# Patient Record
Sex: Male | Born: 1970
Health system: Southern US, Community
[De-identification: ages and names within clinical notes are randomized; demographics above are authoritative.]

## PROBLEM LIST (undated history)

## (undated) DIAGNOSIS — M519 Unspecified thoracic, thoracolumbar and lumbosacral intervertebral disc disorder: Secondary | ICD-10-CM

## (undated) DIAGNOSIS — M459 Ankylosing spondylitis of unspecified sites in spine: Secondary | ICD-10-CM

## (undated) HISTORY — DX: Ankylosing spondylitis of unspecified sites in spine: M45.9

## (undated) HISTORY — PX: LUMBAR DISC SURGERY: SHX700

## (undated) HISTORY — DX: Unspecified thoracic, thoracolumbar and lumbosacral intervertebral disc disorder: M51.9

---

## 2005-11-04 ENCOUNTER — Encounter: Admission: RE | Admit: 2005-11-04 | Discharge: 2005-11-04 | Payer: Self-pay | Admitting: Gastroenterology

## 2005-11-17 ENCOUNTER — Encounter: Admission: RE | Admit: 2005-11-17 | Discharge: 2005-11-17 | Payer: Self-pay | Admitting: *Deleted

## 2008-04-20 ENCOUNTER — Ambulatory Visit: Payer: Self-pay | Admitting: Pulmonary Disease

## 2008-04-20 DIAGNOSIS — R05 Cough: Secondary | ICD-10-CM

## 2008-04-20 DIAGNOSIS — R059 Cough, unspecified: Secondary | ICD-10-CM | POA: Insufficient documentation

## 2008-04-20 DIAGNOSIS — I499 Cardiac arrhythmia, unspecified: Secondary | ICD-10-CM | POA: Insufficient documentation

## 2008-04-20 DIAGNOSIS — M069 Rheumatoid arthritis, unspecified: Secondary | ICD-10-CM | POA: Insufficient documentation

## 2008-04-20 DIAGNOSIS — M5136 Other intervertebral disc degeneration, lumbar region: Secondary | ICD-10-CM | POA: Insufficient documentation

## 2008-04-27 ENCOUNTER — Telehealth (INDEPENDENT_AMBULATORY_CARE_PROVIDER_SITE_OTHER): Payer: Self-pay | Admitting: *Deleted

## 2008-05-08 ENCOUNTER — Ambulatory Visit: Payer: Self-pay | Admitting: Pulmonary Disease

## 2010-08-18 ENCOUNTER — Encounter: Payer: Self-pay | Admitting: Gastroenterology

## 2010-08-27 NOTE — Progress Notes (Signed)
Summary: cough med  Phone Note Call from Patient Call back at [phone]   Caller: Patient Call For: clance Reason for Call: Talk to Nurse Summary of Call: cough medicine hasn't made any difference, can you call in stronger cough med.  He has 2 days off for w/e and can take it. [facility] [address] Initial call taken by: Eugene Gavia,  April 27, 2008 2:17 PM  Follow-up for Phone Call        pt is requesting stonger cough meds.  was given tessalon perles at ov on 9/24.  please advise if you would like to call something stronger in for pt. Marijo File CMA  April 27, 2008 3:37 PM   Additional Follow-up for Phone Call Additional follow up Details #1::        pt called back requesting rx for cough. Tivis Ringer  April 28, 2008 2:38 PM    Additional Follow-up for Phone Call Additional follow up Details #2::    make sure that he stays on kapidex, STOPS chlorpheniramine. can call in tussicaps 10/8 one q12h as needed  #10, no fills make sure he uses his voice as little as possible, no throat clearing Follow-up by: Barbaraann Share MD,  May 01, 2008 3:01 PM  Additional Follow-up for Phone Call Additional follow up Details #3:: Details for Additional Follow-up Action Taken: Pt aware he can try Tussicaps, but will need to STOP the Chlorpheniramine while on the Tussicaps. He will call back to let [facility] know if this works for him. [facility] [address]. Additional Follow-up by: Michel Bickers CMA,  May 01, 2008 3:34 PM  New/Updated Medications: TUSSICAPS 10-8 MG XR12H-CAP (HYDROCOD POLST-CHLORPHEN POLST) 1 by mouth q12h as needed cough   Prescriptions: TUSSICAPS 10-8 MG XR12H-CAP (HYDROCOD POLST-CHLORPHEN POLST) 1 by mouth q12h as needed cough  #10 x 0   Entered by:   Michel Bickers CMA   Authorized by:   Barbaraann Share MD   Signed by:   Michel Bickers CMA on 05/01/2008   Method used:   Telephoned to ...       [facility]  [address] [facility]       [address]       [place], [place]  [address]       Ph: [phone] or [phone]       Fax: [phone]   RxID:   [id number]

## 2010-08-29 ENCOUNTER — Ambulatory Visit
Admission: RE | Admit: 2010-08-29 | Discharge: 2010-08-29 | Disposition: A | Discharge status: Home / Self Care | Payer: BC Managed Care – PPO | Source: Ambulatory Visit | Attending: Orthopedic Surgery | Admitting: Orthopedic Surgery

## 2010-08-29 ENCOUNTER — Other Ambulatory Visit: Payer: Self-pay | Admitting: Orthopedic Surgery

## 2010-08-29 DIAGNOSIS — R52 Pain, unspecified: Secondary | ICD-10-CM

## 2013-11-25 ENCOUNTER — Other Ambulatory Visit: Payer: Self-pay | Admitting: Gastroenterology

## 2013-11-25 DIAGNOSIS — R109 Unspecified abdominal pain: Secondary | ICD-10-CM

## 2013-11-29 ENCOUNTER — Other Ambulatory Visit: Payer: BC Managed Care – PPO

## 2013-12-01 ENCOUNTER — Ambulatory Visit
Admission: RE | Admit: 2013-12-01 | Discharge: 2013-12-01 | Disposition: A | Discharge status: Home / Self Care | Payer: BC Managed Care – PPO | Source: Ambulatory Visit | Attending: Gastroenterology | Admitting: Gastroenterology

## 2013-12-01 DIAGNOSIS — R109 Unspecified abdominal pain: Secondary | ICD-10-CM

## 2016-05-22 ENCOUNTER — Ambulatory Visit: Payer: Self-pay | Admitting: Rheumatology

## 2016-05-29 ENCOUNTER — Encounter: Payer: Self-pay | Admitting: Rheumatology

## 2016-06-03 ENCOUNTER — Other Ambulatory Visit: Payer: Self-pay | Admitting: Rheumatology

## 2016-06-03 NOTE — Telephone Encounter (Signed)
Last visit 12/06/15 Next visit 06/17/16 Labs 05/01/16 TB neg 05/22/16 Ok to refill per Dr Corliss Skainseveshwar

## 2016-06-12 DIAGNOSIS — M461 Sacroiliitis, not elsewhere classified: Secondary | ICD-10-CM | POA: Insufficient documentation

## 2016-06-12 DIAGNOSIS — M459 Ankylosing spondylitis of unspecified sites in spine: Secondary | ICD-10-CM | POA: Insufficient documentation

## 2016-06-12 DIAGNOSIS — Z79899 Other long term (current) drug therapy: Secondary | ICD-10-CM | POA: Insufficient documentation

## 2016-06-12 NOTE — Progress Notes (Deleted)
   Office Visit Note  Patient: Jon Henderson             Date of Birth: 08/15/1970           MRN: [id number]             PCP: No primary care provider on file. Referring: No ref. provider found Visit Date: 06/17/2016 Occupation: @GUAROCC @    Subjective:  No chief complaint on file.   History of Present Illness: Jon Ruizhomas G Whorley is a 45 y.o. male ***   Activities of Daily Living:  Patient reports morning stiffness for *** {minute/hour:19697}.   Patient {ACTIONS;DENIES/REPORTS:21021675::"Denies"} nocturnal pain.  Difficulty dressing/grooming: {ACTIONS;DENIES/REPORTS:21021675::"Denies"} Difficulty climbing stairs: {ACTIONS;DENIES/REPORTS:21021675::"Denies"} Difficulty getting out of chair: {ACTIONS;DENIES/REPORTS:21021675::"Denies"} Difficulty using hands for taps, buttons, cutlery, and/or writing: {ACTIONS;DENIES/REPORTS:21021675::"Denies"}   No Rheumatology ROS completed.   PMFS History:  Patient Active Problem List   Diagnosis Date Noted  . Ankylosing spondylitis (HCC) 06/12/2016  . Sacroiliitis (HCC) 06/12/2016  . High risk medication use 06/12/2016  . ABNORMAL HEART RHYTHMS 04/20/2008  . ARTHRITIS, RHEUMATOID 04/20/2008  . DEGENERATIVE DISC DISEASE 04/20/2008  . COUGH 04/20/2008    No past medical history on file.  No family history on file. No past surgical history on file. Social History   Social History Narrative  . No narrative on file     Objective: Vital Signs: There were no vitals taken for this visit.   Physical Exam   Musculoskeletal Exam: ***  CDAI Exam: No CDAI exam completed.    Investigation: Findings:   Last from Oct 5th 2017 shows CMP with GFR normal.  CBC with diff normal.  TB Gold was negative July 2016, due today    Imaging: No results found.  Speciality Comments: No specialty comments available.    Procedures:  No procedures performed Allergies: Patient has no allergy information on record.   Assessment / Plan: Visit  Diagnoses: Ankylosing spondylitis of sacral region (HCC)  Sacroiliitis (HCC)  High risk medication use - Enbrel neg TB gold9/2016    Orders: No orders of the defined types were placed in this encounter.  No orders of the defined types were placed in this encounter.   Face-to-face time spent with patient was *** minutes. 50% of time was spent in counseling and coordination of care.  Follow-Up Instructions: No Follow-up on file.   Amy Littrell, RT

## 2016-06-17 ENCOUNTER — Ambulatory Visit: Payer: Self-pay | Admitting: Rheumatology

## 2016-06-20 NOTE — Progress Notes (Signed)
Office Visit Note  Patient: Jon Henderson             Date of Birth: 02-16-1971           MRN: [id number]             PCP: Everardo Beals, M.D. Referring: No ref. provider found Visit Date: 06/24/2016 Occupation:     Subjective:  Left second toe pain   History of Present Illness: Jon Henderson is a 45 y.o. male with history of ankylosing spondylitis. He states that he's been very active and busy at work. He denies any joint pain or joint swelling. He has not had any SI joint discomfort. He injured his left second toe for which she went to [facility] urgent care he states the x-ray did not show any fracture. He notices some joint discomfort  related to certain activities.   Activities of Daily Living:  Patient reports morning stiffness for 0 minute.   Patient Denies nocturnal pain.  Difficulty dressing/grooming: Denies Difficulty climbing stairs: Denies Difficulty getting out of chair: Denies Difficulty using hands for taps, buttons, cutlery, and/or writing: Denies   Review of Systems  Constitutional: Negative for fatigue, night sweats and weakness ( ).  HENT: Negative for mouth sores, mouth dryness and nose dryness.   Eyes: Negative for redness and dryness.  Respiratory: Negative for shortness of breath and difficulty breathing.   Cardiovascular: Negative for chest pain, palpitations, hypertension, irregular heartbeat and swelling in legs/feet.  Gastrointestinal: Negative for constipation and diarrhea.  Endocrine: Negative for increased urination.  Musculoskeletal: Positive for arthralgias and joint pain. Negative for joint swelling, myalgias, muscle weakness, morning stiffness, muscle tenderness and myalgias.  Skin: Negative for color change, rash, hair loss, nodules/bumps, skin tightness, ulcers and sensitivity to sunlight.  Allergic/Immunologic: Negative for susceptible to infections.  Neurological: Negative for dizziness, fainting, memory loss and night sweats.    Hematological: Negative for swollen glands.  Psychiatric/Behavioral: Negative for depressed mood and sleep disturbance. The patient is not nervous/anxious.     PMFS History:  Patient Active Problem List   Diagnosis Date Noted  . Ankylosing spondylitis ([place]) 06/12/2016  . Sacroiliitis ([place]) 06/12/2016  . High risk medication use 06/12/2016  . ABNORMAL HEART RHYTHMS 04/20/2008  . DEGENERATIVE DISC DISEASE 04/20/2008  . COUGH 04/20/2008    Past Medical History:  Diagnosis Date  . Ankylosing spondylitis ([place])   . Disc disorder of lumbar region     Family History  Problem Relation Age of Onset  . COPD Father   . Stroke Maternal Grandfather    Past Surgical History:  Procedure Laterality Date  . LUMBAR DISC SURGERY     Social History   Social History Narrative  . No narrative on file     Objective: Vital Signs: BP 135/90 (BP Location: Left Arm, Patient Position: Sitting, Cuff Size: Large)   Pulse 77   Resp 14   Ht 6' (1.829 m)   Wt 197 lb (89.4 kg)   BMI 26.72 kg/m    Physical Exam  Constitutional: He is oriented to person, place, and time. He appears well-developed and well-nourished.  HENT:  Head: Normocephalic and atraumatic.  Eyes: Conjunctivae and EOM are normal. Pupils are equal, round, and reactive to light.  Neck: Normal range of motion. Neck supple.  Cardiovascular: Normal rate, regular rhythm and normal heart sounds.   Pulmonary/Chest: Effort normal and breath sounds normal.  Abdominal: Soft. Bowel sounds are normal.  Neurological: He is alert and  oriented to person, place, and time.  Skin: Skin is warm and dry. Capillary refill takes less than 2 seconds.  Psychiatric: He has a normal mood and affect. His behavior is normal.  Nursing note and vitals reviewed.    Musculoskeletal Exam: C-spine and thoracic lumbar spine good range of motion. No SI joint tenderness. Shoulder joints elbow joints wrist joint MCPs PIPs DIPs with good range of motion with no  synovitis hip joints knee joints ankles MTPs PIPs were good range of motion with no  synovitis.  CDAI Exam: No CDAI exam completed.    Investigation: Findings:  July 2010 SPEP normal, hepatitis panel negative, July 2016 TAB: Negative, 05/02/2016 CMP normal, CBC normal    Imaging: No results found.  Speciality Comments: No specialty comments available.    Procedures:  No procedures performed Allergies: Patient has no allergy information on record.   Assessment / Plan:     Visit Diagnoses: Ankylosing spondylitis  - HLA-B27 positive, rheumatoid factor positive, history of Sacroiliitis, iritis: He is doing really well with no SI joint pain no recurrence of iritis. He has no synovitis on examination.  High risk medication use - Enbrel every week. His labs are stable. He states he had his TB gold with his PCP and he will send the results to [place]. 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 Vertell Limber : Doing well.   Orders: Orders Placed This Encounter  Procedures  . CBC with Differential/Platelet  . COMPLETE METABOLIC PANEL WITH GFR   No orders of the defined types were placed in this encounter.   Face-to-face time spent with patient was 25 minutes. 50% of time was spent in counseling and coordination of care.  Follow-Up Instructions: Return in about 5 months (around 11/22/2016) for Ankylosing spondylitis.   Bo Merino, MD

## 2016-06-24 ENCOUNTER — Encounter: Payer: Self-pay | Admitting: Rheumatology

## 2016-06-24 ENCOUNTER — Ambulatory Visit (INDEPENDENT_AMBULATORY_CARE_PROVIDER_SITE_OTHER): Payer: BLUE CROSS/BLUE SHIELD | Admitting: Rheumatology

## 2016-06-24 VITALS — BP 135/90 | HR 77 | Resp 14 | Ht 72.0 in | Wt 197.0 lb

## 2016-06-24 DIAGNOSIS — Z79899 Other long term (current) drug therapy: Secondary | ICD-10-CM | POA: Diagnosis not present

## 2016-06-24 DIAGNOSIS — M457 Ankylosing spondylitis of lumbosacral region: Secondary | ICD-10-CM

## 2016-06-24 DIAGNOSIS — M47816 Spondylosis without myelopathy or radiculopathy, lumbar region: Secondary | ICD-10-CM

## 2016-06-24 NOTE — Patient Instructions (Signed)
Standing Labs We placed an order today for your standing lab work.    Please come back and get your standing labs in January and every 3 months  We have open lab Monday through Friday from 8:30-11:30 AM and 1-4 PM at the office of Dr. Gibran Veselka/Naitik Panwala, PA.   The office is located at [address], [address], [place], [place] [address] No appointment is necessary.   Labs are drawn by [facility].  You may receive a bill from [facility] for your lab work.    

## 2016-06-24 NOTE — Progress Notes (Signed)
Rheumatology Medication Review by a Pharmacist Does the patient feel that his/her medications are working for him/her?  Yes Has the patient been experiencing any side effects to the medications prescribed?  Yes Does the patient have any problems obtaining medications?  No  Issues to address at subsequent visits: None   Pharmacist comments:  Mr. Jon Henderson is a pleasant 45yo M presenting for follow up of his ankylosing spondylitis.  Patient is currently taking Enbrel 50 mg weekly and reports adherence with Enbrel.  Patient reports having itching at his injection site.  Advised patient to take an anti-histamine prior to injection to reduce itching.  He reports he has been told to do this before but forgets.  Patient denies any other medication related questions or concerns at this time.     Lilla Shookachel Lancelot Alyea, Pharm.D., BCPS Clinical Pharmacist Pager: [phone] Phone: [phone] 06/24/2016 3:51 PM

## 2016-08-26 ENCOUNTER — Other Ambulatory Visit: Payer: Self-pay | Admitting: Radiology

## 2016-08-26 NOTE — Telephone Encounter (Signed)
TB gold negative on 05/18/16 Labs were due this month Last visit 05/2016 Next visit 11/24/2016 Called patient to ask about his CBC CMP / when will he have these or have they been done at PCP already? He states he will go today for labs

## 2016-08-26 NOTE — Telephone Encounter (Signed)
Refill request received via fax for Enbrel [facility]

## 2016-08-28 ENCOUNTER — Telehealth: Payer: Self-pay | Admitting: Rheumatology

## 2016-08-28 NOTE — Telephone Encounter (Signed)
Attempted to contact the patient regarding his labs. Left message for patient to call the office.

## 2016-08-28 NOTE — Telephone Encounter (Signed)
Specialty Pharmacy called about Enbrel refill request they faxed over. They are requesting a call back at [phone] ext. [phone]

## 2016-08-28 NOTE — Telephone Encounter (Signed)
Advised them we are awaiting the patient's labs before we respond to the refill request and we have reached out to the patient.

## 2016-08-29 ENCOUNTER — Telehealth: Payer: Self-pay | Admitting: Rheumatology

## 2016-08-29 NOTE — Telephone Encounter (Signed)
Patient returned Andrea's call. Please call patient back.  

## 2016-08-29 NOTE — Telephone Encounter (Signed)
Advised patient we are looking for his lab results. Patient states he had them done Tuesday and is calling to have the results faxed to our office.

## 2016-09-02 MED ORDER — ETANERCEPT 50 MG/ML ~~LOC~~ SOAJ
SUBCUTANEOUS | 2 refills | Status: DC
Start: 2016-09-02 — End: 2016-09-12

## 2016-09-02 NOTE — Telephone Encounter (Signed)
Last visit 05/2016 Next visit 11/24/2016 Labs:08/28/15 WNL Tb Gold: 05/17/16 Neg  Okay to refill Enbrel?

## 2016-09-02 NOTE — Addendum Note (Signed)
Addended by: Henriette CombsHATTON, Keyundra Fant L on: 09/02/2016 09:09 AM   Modules accepted: Orders

## 2016-09-03 ENCOUNTER — Telehealth: Payer: Self-pay | Admitting: *Deleted

## 2016-09-03 NOTE — Telephone Encounter (Signed)
Patient in office to pick up sample that was okayed per [name]  Medication Samples have been provided to the patient.  Drug name: Enbrel       Strength:50 mg/mL        Qty:1  LOT: [id number]  Exp.Date:1/20  Dosing instructions: Inject 1 pen SQ every week  The patient has been instructed regarding the correct time, dose, and frequency of taking this medication, including desired effects and most common side effects.   Jon Henderson 3:33 PM 09/03/2016

## 2016-09-12 ENCOUNTER — Telehealth: Payer: Self-pay | Admitting: Pharmacist

## 2016-09-12 MED ORDER — ETANERCEPT 50 MG/ML ~~LOC~~ SOAJ: SUBCUTANEOUS | 2 refills | Status: DC

## 2016-09-12 NOTE — Telephone Encounter (Signed)
Medication Samples have been provided to the patient.  Drug name: Enbrel       Strength: 50 mg        Qty: 2  LOT: [id number]  Exp.Date: 07/2018  Dosing instructions: Inject under the skin once a week.   The patient has been instructed regarding the correct time, dose, and frequency of taking this medication, including desired effects and most common side effects.   Informed patient we just received a fax from his insurance stating [facility] is the required pharmacy and PA is required for Enbrel.  PA submitted through Cover My Meds.  Will update patient once I know status of PA.    Sue Lushndrea, can you resend patient's Enbrel prescription to [facility]?  Thanks!   Jon MiresRachel L Henderson 4:09 PM 09/12/2016

## 2016-09-12 NOTE — Telephone Encounter (Signed)
Sample medication labeled and set aside in the refrigerator for patient to pick up.

## 2016-09-12 NOTE — Telephone Encounter (Signed)
Prescription sent to pharmacy.

## 2016-09-12 NOTE — Telephone Encounter (Signed)
Okay to give patient 2 boxes of Enbrel samples pleaseIf you don't mind, can we asked Jon Henderson to get us more Enbrel for our stock

## 2016-09-12 NOTE — Telephone Encounter (Signed)
Received two faxes from [facility] regarding patient's Enbrel.  The first fax stated they need patient's insurance and ICD 10 code related to Enbrel.  The second fax stated that patient has a new specialty pharmacy and has been unenrolled.    Reviewed patient's chart.  I do not see that the prescription has been sent to any other specialty pharmacy.  I called patient to discuss.  He states he got his Enbrel coupon card, but lost it so he has to get a new one before he can fill his Enbrel.  He said he does not know why he has been unenrolled from [facility] as he plans to fill the prescription there.  I reviewed patient's insurance and it appears that [facility] is the required pharmacy.  Patient reports he will look into his insurance plan more and let us know if we need to resend the prescription.  Patient plans to order new Enbrel card and call his insurance today.  Patient states he is out of Enbrel and requested another sample.   Last visit: 06/24/16 Next visit: 11/24/16 Labs: 08/27/16 CBC normal, CMP normal; 05/18/16 TB negative  [name], okay to give patient Enbrel sample?

## 2016-09-16 ENCOUNTER — Telehealth: Payer: Self-pay | Admitting: Pharmacist

## 2016-09-16 NOTE — Telephone Encounter (Signed)
Received notification from Cover My Meds that patient's Enbrel was approved.  Can you please update patient?

## 2016-09-16 NOTE — Telephone Encounter (Signed)
Left a message on patients cell phone. Told him to call back if he had any questions.

## 2016-09-16 NOTE — Telephone Encounter (Signed)
Spoke to patient.  He confirms that [facility] is currently processing his prescription.  Advised him to call us if he has any further questions or concerns regarding his medication.

## 2016-09-18 ENCOUNTER — Telehealth: Payer: Self-pay | Admitting: Pharmacist

## 2016-09-18 NOTE — Telephone Encounter (Signed)
Received fax from [facility] requesting ICD-10 code for patient's Enbrel prescription.  [facility] and provided her with the diagnosis code M45.7 as documented in patient's chart.     Lilla Shookachel Amarea Macdowell, Pharm.D., BCPS, CPP Clinical Pharmacist Pager: [phone] Phone: [phone] 09/18/2016 8:52 AM

## 2016-11-21 NOTE — Assessment & Plan Note (Signed)
TB gold negative 05/18/2016 Enbrel 50 mg every week(adequate response)

## 2016-11-21 NOTE — Progress Notes (Signed)
Office Visit Note  Patient: Jon Henderson             Date of Birth: [birth year]           MRN: [id number]             PCP: Imelda Pillow, NP Referring: Everardo Beals, NP Visit Date: 11/24/2016 Occupation: '@GUAROCC'$ @    Subjective:  Follow-up (Need RX Celebrex and Protonox)   History of Present Illness: Jon Henderson is a 46 y.o. male  Last seen 06/24/2016 for ankylosing spondylitis(HLA-B27 positive)/rheumatoid factor positive . On that visit, patient reported that he was doing very well with his ankylosing spondylitis. He was taking Enbrel 50 mg weekly and was not experiencing any SI joint discomfort. He also did not have any recurrence of iritis.  Today, patient is reporting that he is having some flares to his left fourth and fifth MCP joint. Usually happens on day for 4-5 after his Enbrel injection. By the time he is due for the injection is MCP joints are quite bothersome. After the patient gets his Enbrel injection, he takes about a day or 2 for his joints is homebound. We're uncertain whether he is getting inadequate response from Enbrel because of all the extra work that he is doing or is the Enbrel no longer working well.  Left hand pain and fourth and fifth MCP joint is a new complaint for the patient.  In the past, he was doing really well with all of his joints including his SI joints.     Activities of Daily Living:  Patient reports morning stiffness for 30 minutes.   Patient Reports nocturnal pain.  Difficulty dressing/grooming: Denies Difficulty climbing stairs: Denies Difficulty getting out of chair: Denies Difficulty using hands for taps, buttons, cutlery, and/or writing: Reports   Review of Systems  Constitutional: Negative for fatigue.  HENT: Negative for mouth sores and mouth dryness.   Eyes: Negative for dryness.  Respiratory: Negative for shortness of breath.   Gastrointestinal: Negative for constipation and diarrhea.  Musculoskeletal:  Negative for myalgias and myalgias.  Skin: Negative for sensitivity to sunlight.  Neurological: Negative for memory loss.  Psychiatric/Behavioral: Negative for sleep disturbance.    PMFS History:  Patient Active Problem List   Diagnosis Date Noted  . Ankylosing spondylitis ([place]) 06/12/2016  . Sacroiliitis ([place]) 06/12/2016  . High risk medication use 06/12/2016  . ABNORMAL HEART RHYTHMS 04/20/2008  . DEGENERATIVE DISC DISEASE 04/20/2008  . COUGH 04/20/2008    Past Medical History:  Diagnosis Date  . Ankylosing spondylitis ([place])   . Disc disorder of lumbar region     Family History  Problem Relation Age of Onset  . COPD Father   . Stroke Maternal Grandfather    Past Surgical History:  Procedure Laterality Date  . LUMBAR DISC SURGERY     Social History   Social History Narrative  . No narrative on file     Objective: Vital Signs: BP 132/77 (BP Location: Left Arm, Patient Position: Sitting, Cuff Size: Normal)   Pulse 82   Resp 12   Ht 6' (1.829 m)   Wt 197 lb (89.4 kg)   BMI 26.72 kg/m    Physical Exam  Constitutional: He is oriented to person, place, and time. He appears well-developed and well-nourished.  HENT:  Head: Normocephalic and atraumatic.  Eyes: Conjunctivae and EOM are normal. Pupils are equal, round, and reactive to light.  Neck: Normal range of motion. Neck supple.  Cardiovascular: Normal  rate, regular rhythm and normal heart sounds.  Exam reveals no gallop and no friction rub.   No murmur heard. Pulmonary/Chest: Effort normal and breath sounds normal. No respiratory distress. He has no wheezes. He has no rales. He exhibits no tenderness.  Abdominal: Soft. He exhibits no distension and no mass. There is no tenderness. There is no guarding.  Musculoskeletal: Normal range of motion.  Lymphadenopathy:    He has no cervical adenopathy.  Neurological: He is alert and oriented to person, place, and time. He exhibits normal muscle tone. Coordination  normal.  Skin: Skin is warm and dry. Capillary refill takes less than 2 seconds. No rash noted.  Psychiatric: He has a normal mood and affect. His behavior is normal. Judgment and thought content normal.  Vitals reviewed.    Musculoskeletal Exam:  Full range of motion of all joints Grip strength is equal and strong bilaterally Fiber myalgia tender points are all absent  CDAI Exam: No CDAI exam completed.  No synovitis on examination  Investigation: No additional findings.  See scanned lab results in MEDIA TAB.  Patient is getting labs done at [facility] urgent care. TB gold negative 05/18/2016 CBC with differential ; CMP with GFR within normal limits August 27, 2016   Imaging: No results found.  Speciality Comments: No specialty comments available.    Procedures:  No procedures performed Allergies: Patient has no allergy information on record.   Assessment / Plan:     Visit Diagnoses: Ankylosing spondylitis of sacral region [facility]) - 11/24/2016: No SI joint pain; no recurrence of iritis; adequate response with Enbrel 50 mg every week  High risk medication use - TB gold negative 10/22/2017Enbrel 50 mg every week(adequate response) - Plan: CBC with Differential/Platelet, CMP14+EGFR, CBC with Differential/Platelet, CMP14+EGFR, CBC with Differential/Platelet, CMP14+EGFR  Sacroiliitis (H[facility])    Plan: #1: Ankylosing spondylitis Possible INadequate response with Enbrel 50 mg every week No SI joint pain No recurrence of iritis  #2: High risk prescription. TB gold negative 05/18/2016 Enbrel 50 mg every week( possible inadequate response-starting to complain of pain to left 4th and 5th mcp joint on approx day 4-5 after enbrel injection.)  #3: Left hand pain at the fourth and fifth MCP joint Possible in neck or response to Enbrel Possible overuse syndrome  #4: Right knee pain rated 6 on a scale of 0-10 since early December 2017. Starting late April 2018, patient's  right knee pain finally resolved. Patient does not want any investigation at this time but he will let us know if the right knee begins to bother him once again  #5: Schedule ultrasound of bilateral hands and wrists on a Wednesday with Dr. Estanislado Pandy. Patient usually takes his injections of Enbrel on Wednesday. Since patient has significant pain on the day that his injection is due, it is likely that we might be able to find synovitis if patient is getting inadequate response to Enbrel.  #6: CBC with differential and CMP with GFR at lab core this week. Patient likes to go to [facility] urgent care and will get his labs done there and forwarded to our office. Please see scanned labs in media TAB. The last labs we have is from 08/26/2016.  #7: Return to clinic in 5 months for regular follow-up and as scheduled for ultrasound of bilateral hands due to having left fourth and fifth MCP with pain  Orders: Orders Placed This Encounter  Procedures  . CBC with Differential/Platelet  . CMP14+EGFR  . CBC with Differential/Platelet  .  CMP14+EGFR   No orders of the defined types were placed in this encounter.   Face-to-face time spent with patient was 30 minutes. 50% of time was spent in counseling and coordination of care.  Follow-Up Instructions: Return in about 5 months (around 04/26/2017) for A.S. // left 4th-5th MCP pain// Enbrel//rt knee pain.   Eliezer Lofts, PA-C  Note - This record has been created using [employer].  Chart creation errors have been sought, but may not always  have been located. Such creation errors do not reflect on  the standard of medical care.

## 2016-11-21 NOTE — Assessment & Plan Note (Signed)
HLA-B27 positive Rheumatoid factor positive

## 2016-11-24 ENCOUNTER — Ambulatory Visit (INDEPENDENT_AMBULATORY_CARE_PROVIDER_SITE_OTHER): Payer: BLUE CROSS/BLUE SHIELD | Admitting: Rheumatology

## 2016-11-24 ENCOUNTER — Encounter: Payer: Self-pay | Admitting: Rheumatology

## 2016-11-24 VITALS — BP 132/77 | HR 82 | Resp 12 | Ht 72.0 in | Wt 197.0 lb

## 2016-11-24 DIAGNOSIS — M461 Sacroiliitis, not elsewhere classified: Secondary | ICD-10-CM

## 2016-11-24 DIAGNOSIS — M458 Ankylosing spondylitis sacral and sacrococcygeal region: Secondary | ICD-10-CM | POA: Diagnosis not present

## 2016-11-24 DIAGNOSIS — Z79899 Other long term (current) drug therapy: Secondary | ICD-10-CM

## 2016-11-24 NOTE — Progress Notes (Signed)
Rheumatology Medication Review by a Pharmacist Does the patient feel that his/her medications are working for him/her?  Yes Has the patient been experiencing any side effects to the medications prescribed?  No Does the patient have any problems obtaining medications?  No  Issues to address at subsequent visits: None   Pharmacist comments:  Jon Henderson is a pleasant 46 yo M who presents for follow up of ankylosing spondylitis.  He is currently taking Enbrel 50 mg weekly.  He reports adherence with his medications, and denies any adverse events.  He previously had mild injection site reactions, but reports those have resolved.  Patient had trouble getting his Enbrel in February.  Patient confirms he received his Enbrel from [employer].  Patient denies any questions or concerns regarding his medications at this time.  Lilla Shook, Pharm.D., BCPS, CPP Clinical Pharmacist Pager: [phone] Phone: [phone] 11/24/2016 4:27 PM

## 2016-12-11 ENCOUNTER — Telehealth: Payer: Self-pay | Admitting: *Deleted

## 2016-12-11 NOTE — Telephone Encounter (Signed)
Received labs drawn on 11/28/16 CBC and CMP WNL

## 2016-12-12 ENCOUNTER — Other Ambulatory Visit: Payer: Self-pay | Admitting: *Deleted

## 2016-12-12 MED ORDER — ETANERCEPT 50 MG/ML ~~LOC~~ SOAJ: SUBCUTANEOUS | 2 refills | Status: DC

## 2016-12-12 NOTE — Telephone Encounter (Signed)
Refill request received via fax for Enbrel  Last Visit: 11/24/16 Next Visit: 04/29/17 Labs: 11/28/16 CMP/CBC WNL TB Gold: 05/17/17 Neg  Okay to refill per Dr. Corliss Skainseveshwar

## 2016-12-15 ENCOUNTER — Telehealth: Payer: Self-pay | Admitting: Rheumatology

## 2016-12-15 NOTE — Telephone Encounter (Signed)
[employer] calling to request rf on Enbrel be refaxed. They do not have it. Fax# [phone] Jon with [employer].

## 2016-12-15 NOTE — Telephone Encounter (Signed)
Called [employer] and gave verbal authorization for refill of enbrel.

## 2016-12-26 NOTE — Progress Notes (Signed)
Assessment / Plan:     Visit Diagnoses: Ankylosing spondylitis of sacral region [facility]) - 11/24/2016: No SI joint pain; no recurrence of iritis; adequate response with Enbrel 50 mg every week  High risk medication use - TB gold negative 10/22/2017Enbrel 50 mg every week(adequate response) - Plan: CBC with Differential/Platelet, CMP14+EGFR, CBC with Differential/Platelet, CMP14+EGFR, CBC with Differential/Platelet, CMP14+EGFR  Sacroiliitis (HCC)    Plan: #1: Ankylosing spondylitis Possible INadequate response with Enbrel 50 mg every week No SI joint pain No recurrence of iritis  #2: High risk prescription. TB gold negative 05/18/2016 Enbrel 50 mg every week( possible inadequate response-starting to complain of pain to left 4th and 5th mcp joint on approx day 4-5 after enbrel injection.)  #3: Left hand pain at the fourth and fifth MCP joint Possible in neck or response to Enbrel Possible overuse syndrome  #4: Right knee pain rated 6 on a scale of 0-10 since early December 2017. Starting late April 2018, patient's right knee pain finally resolved. Patient does not want any investigation at this time but he will let us know if the right knee begins to bother him once again  #5: Schedule ultrasound of bilateral hands and wrists on a Wednesday with Dr. Estanislado Pandy. Patient usually takes his injections of Enbrel on Wednesday. Since patient has significant pain on the day that his injection is due, it is likely that we might be able to find synovitis if patient is getting inadequate response to Enbrel.  #6: CBC with differential and CMP with GFR at lab core this week. Patient likes to go to [facility] urgent care and will get his labs done there and forwarded to our office. Please see scanned labs in media TAB. The last labs we have is from 08/26/2016.  #7: Return to clinic in 5 months for regular follow-up and as scheduled for ultrasound of bilateral hands due to having left  fourth and fifth MCP with pain  Orders:    Orders Placed This Encounter  Procedures  . CBC with Differential/Platelet  . CMP14+EGFR  . CBC with Differential/Platelet  . CMP14+EGFR   No orders of the defined types were placed in this encounter.   Face-to-face time spent with patient was 30 minutes. 50% of time was spent in counseling and coordination of care.  Follow-Up Instructions: Return in about 5 months (around 04/26/2017) for A.S. // left 4th-5th MCP pain// Enbrel//rt knee pain.

## 2016-12-31 ENCOUNTER — Ambulatory Visit (INDEPENDENT_AMBULATORY_CARE_PROVIDER_SITE_OTHER): Payer: BLUE CROSS/BLUE SHIELD | Admitting: Rheumatology

## 2016-12-31 ENCOUNTER — Telehealth: Payer: Self-pay | Admitting: Pharmacist

## 2016-12-31 ENCOUNTER — Inpatient Hospital Stay (INDEPENDENT_AMBULATORY_CARE_PROVIDER_SITE_OTHER): Payer: Self-pay

## 2016-12-31 DIAGNOSIS — M79641 Pain in right hand: Secondary | ICD-10-CM

## 2016-12-31 DIAGNOSIS — M79642 Pain in left hand: Secondary | ICD-10-CM

## 2016-12-31 NOTE — Telephone Encounter (Signed)
Dr. Corliss Skainseveshwar wants to consider switching patient to Cosentyx at patient's follow up visit.  Can you submit prior authorization for Cosentyx 150 mg dose?

## 2017-01-01 ENCOUNTER — Telehealth: Payer: Self-pay

## 2017-01-01 NOTE — Telephone Encounter (Signed)
Submitted a prior authorization for Cosentyx through Cover My Meds. Will update once we receive a response.   Janis Sol, [place], CPhT 9:35 AM

## 2017-01-05 NOTE — Telephone Encounter (Signed)
Received fax asking for qualification on whether the request was for the treatment of ankylosing spondylitis with a loading dose.  Provided clarification that loading dose will be used.  Form was faxed back to patient's insurance.    Lilla Shookachel Henderson, Pharm.D., BCPS, CPP Clinical Pharmacist Pager: [phone] Phone: [phone] 01/05/2017 11:19 AM

## 2017-01-05 NOTE — Progress Notes (Signed)
Office Visit Note  Patient: Jon Henderson             Date of Birth: 04-03-1971           MRN: [id number]             PCP: Marva PandaMillsaps, Kimberly, NP Referring: Marva PandaMillsaps, Kimberly, NP Visit Date: 01/07/2017 Occupation: @GUAROCC @    Subjective:  Pain and swelling in hands.   History of Present Illness: Jon Henderson is a 46 y.o. male with history of ankylosing spondylitis. He's been having increased pain and swelling in his hands recently. We did ultrasound examination of bilateral hands on 12/31/2016 which showed synovitis in his bilateral hands. He's been on Enbrel for since 2007. It is not effective anymore. His SI joints are not as painful. He has some discomfort which is work-related. He gives history of intermittent diarrhea which she believes is secondary to IBS. He has had endoscopy and colonoscopy 2 years ago which was negative except for reflux.  Activities of Daily Living:  Patient reports morning stiffness for 15 minutes.   Patient Reports nocturnal pain.  Difficulty dressing/grooming: Denies Difficulty climbing stairs: Denies Difficulty getting out of chair: Denies Difficulty using hands for taps, buttons, cutlery, and/or writing: Denies   Review of Systems  Constitutional: Positive for fatigue. Negative for night sweats and weakness ( ).  HENT: Negative for mouth sores, mouth dryness and nose dryness.   Eyes: Negative for redness and dryness.  Respiratory: Negative for shortness of breath and difficulty breathing.   Cardiovascular: Negative for chest pain, palpitations, hypertension, irregular heartbeat and swelling in legs/feet.  Gastrointestinal: Positive for diarrhea. Negative for constipation.       Occasional  Endocrine: Negative for increased urination.  Musculoskeletal: Positive for arthralgias, joint pain and morning stiffness. Negative for joint swelling, myalgias, muscle weakness, muscle tenderness and myalgias.  Skin: Negative for color change, rash, hair  loss, nodules/bumps, skin tightness, ulcers and sensitivity to sunlight.  Allergic/Immunologic: Negative for susceptible to infections.  Neurological: Negative for dizziness, fainting, memory loss and night sweats.  Hematological: Negative for swollen glands.  Psychiatric/Behavioral: Negative for depressed mood and sleep disturbance. The patient is not nervous/anxious.     PMFS History:  Patient Active Problem List   Diagnosis Date Noted  . Ankylosing spondylitis (HCC) 06/12/2016  . Sacroiliitis ([facility]) 06/12/2016  . High risk medication use 06/12/2016  . ABNORMAL HEART RHYTHMS 04/20/2008  . DDD (degenerative disc disease), lumbar 04/20/2008  . COUGH 04/20/2008    Past Medical History:  Diagnosis Date  . Ankylosing spondylitis (HCC)   . Disc disorder of lumbar region     Family History  Problem Relation Age of Onset  . COPD Father   . Stroke Maternal Grandfather    Past Surgical History:  Procedure Laterality Date  . LUMBAR DISC SURGERY     Social History   Social History Narrative  . No narrative on file     Objective: Vital Signs: BP 127/78 (BP Location: Left Arm, Patient Position: Sitting, Cuff Size: Normal)   Pulse 83   Resp 14   Ht 6' (1.829 m)   Wt 194 lb (88 kg)   BMI 26.31 kg/m    Physical Exam  Constitutional: He is oriented to person, place, and time. He appears well-developed and well-nourished.  HENT:  Head: Normocephalic and atraumatic.  Eyes: Conjunctivae and EOM are normal. Pupils are equal, round, and reactive to light.  Neck: Normal range of motion. Neck supple.  Cardiovascular:  Normal rate, regular rhythm and normal heart sounds.   Pulmonary/Chest: Effort normal and breath sounds normal.  Abdominal: Soft. Bowel sounds are normal.  Neurological: He is alert and oriented to person, place, and time.  Skin: Skin is warm and dry. Capillary refill takes less than 2 seconds.  Psychiatric: He has a normal mood and affect. His behavior is normal.    Nursing note and vitals reviewed.    Musculoskeletal Exam: C-spine and thoracic lumbar spine good range of motion. Shoulder joints elbow joints wrist joint MCPs PIPs DIPs with good range of motion. He has some tenderness over her MCP joints and wrists joint as described below. Hip joints knee joints ankles MTPs PIPs with good range of motion with no synovitis. He had no tenderness over SI joint area.  CDAI Exam: CDAI Homunculus Exam:   Tenderness:  RUE: wrist Right hand: 4th MCP and 5th MCP Left hand: 5th MCP  Joint Counts:  CDAI Tender Joint count: 4 CDAI Swollen Joint count: 0  Global Assessments:  Patient Global Assessment: 5 Provider Global Assessment: 5  CDAI Calculated Score: 14    Investigation: Findings:  TB gold negative 05/18/2016  11/28/16 CBC and CMP normal      Imaging: [place] Extrem Up Bilat Comp  Result Date: 12/31/2016 Ultrasound examination of bilateral hands was performed per EULAR recommendations. Using 12 MHz transducer, grayscale and power Doppler bilateral second, third, and fifth MCP joints and bilateral wrist joints both dorsal and volar aspects were evaluated to look for synovitis or tenosynovitis. The findings were there was  synovitis in bilateral second third and fifth MCP joints and bilateral wrist joints  on ultrasound examination. Right median nerve was 0.07 cm squares which was within normal limits and left median nerve was 0.11 cm squares which was within normal limits.. Impression: Ultrasound examination of bilateral hand shows synovitis in bilateral second third and fifth MCP joints and bilateral wrist joints consistent with flare of inflammatory arthritis.   Speciality Comments: No specialty comments available.    Procedures:  No procedures performed Allergies: Patient has no allergy information on record.   Assessment / Plan:     Visit Diagnoses: Ankylosing spondylitis of sacral region [facility]) - Ultrasound examination of bilateral hands  on 12/31/2016 showed bilateral hands synovitis. Patient is not responding to Enbrel anymore. He continues to have some tenderness some in his hands and wrists joints. He believes that the Enbrel is not injecting properly. He had discussion regarding different treatment options and side effects at length. After reviewing indications CONTRAINDICATIONS were decided to proceed with Cosyntex. We will apply for Cosyntex. He will use discontinue Enbrel one week prior to his Cosyntex  Dose.   Sacroiliitis [facility]): Not active  High risk medication use - Inadequate response to Enbrel. Start Cosyntex after approved. Plan labs a month after his first Cosyntex dose and then we'll monitor every 3 months.- Plan: HIV antibody, IgG, IgA, IgM, CBC with Differential/Platelet, COMPLETE METABOLIC PANEL WITH GFR, HIV antibody, IgG, IgA, IgM  DDD (degenerative disc disease), lumbar: Doing fairly well is intermittent discomfort related to activities.  History of gastroesophageal reflux (GERD): Controlled with medication     Orders: Orders Placed This Encounter  Procedures  . HIV antibody  . IgG, IgA, IgM   No orders of the defined types were placed in this encounter.   Face-to-face time spent with patient was 30 minutes. 50% of time was spent in counseling and coordination of care.  Follow-Up Instructions: Return in about 3  months (around 04/09/2017) for Ankylosing spondylitis.   Pollyann Savoy, MD  Note - This record has been created using [employer].  Chart creation errors have been sought, but may not always  have been located. Such creation errors do not reflect on  the standard of medical care.

## 2017-01-07 ENCOUNTER — Ambulatory Visit (INDEPENDENT_AMBULATORY_CARE_PROVIDER_SITE_OTHER): Payer: BLUE CROSS/BLUE SHIELD | Admitting: Rheumatology

## 2017-01-07 ENCOUNTER — Encounter: Payer: Self-pay | Admitting: Rheumatology

## 2017-01-07 VITALS — BP 127/78 | HR 83 | Resp 14 | Ht 72.0 in | Wt 194.0 lb

## 2017-01-07 DIAGNOSIS — M458 Ankylosing spondylitis sacral and sacrococcygeal region: Secondary | ICD-10-CM | POA: Diagnosis not present

## 2017-01-07 DIAGNOSIS — M5136 Other intervertebral disc degeneration, lumbar region: Secondary | ICD-10-CM | POA: Diagnosis not present

## 2017-01-07 DIAGNOSIS — Z79899 Other long term (current) drug therapy: Secondary | ICD-10-CM

## 2017-01-07 DIAGNOSIS — M461 Sacroiliitis, not elsewhere classified: Secondary | ICD-10-CM

## 2017-01-07 DIAGNOSIS — Z8719 Personal history of other diseases of the digestive system: Secondary | ICD-10-CM

## 2017-01-07 LAB — COMPLETE METABOLIC PANEL WITH GFR

## 2017-01-07 LAB — HIV ANTIBODY (ROUTINE TESTING W REFLEX)

## 2017-01-07 LAB — IGG, IGA, IGM

## 2017-01-07 NOTE — Patient Instructions (Signed)
Secukinumab injection What is this medicine? SECUKINUMAB (sek ue KIN ue mab) is used to treat psoriasis. It is also used to treat psoriatic arthritis and ankylosing spondylitis. This medicine may be used for other purposes; ask your health care provider or pharmacist if you have questions. COMMON BRAND NAME(S): Cosentyx What should I tell my health care provider before I take this medicine? They need to know if you have any of these conditions: -Crohn's disease, ulcerative colitis, or other inflammatory bowel disease -infection or history of infection -other conditions affecting the immune system -recently received or are scheduled to receive a vaccine -tuberculosis, a positive skin test for tuberculosis, or have recently been in close contact with someone who has tuberculosis -an unusual or allergic reaction to secukinumab, other medicines, latex, rubber, foods, dyes, or preservatives -pregnant or trying to get pregnant -breast-feeding How should I use this medicine? This medicine is for injection under the skin. It may be administered by a healthcare professional in a hospital or clinic setting or at home. If you get this medicine at home, you will be taught how to prepare and give this medicine. Use exactly as directed. Take your medicine at regular intervals. Do not take your medicine more often than directed. It is important that you put your used needles and syringes in a special sharps container. Do not put them in a trash can. If you do not have a sharps container, call your pharmacist or healthcare provider to get one. A special MedGuide will be given to you by the pharmacist with each prescription and refill. Be sure to read this information carefully each time. Talk to your pediatrician regarding the use of this medicine in children. Special care may be needed. Overdosage: If you think you have taken too much of this medicine contact a poison control center or emergency room at  once. NOTE: This medicine is only for you. Do not share this medicine with others. What if I miss a dose? It is important not to miss your dose. Call your doctor of health care professional if you are unable to keep an appointment. If you give yourself the medicine and you miss a dose, take it as soon as you can. If it is almost time for your next dose, take only that dose. Do not take double or extra doses. What may interact with this medicine? Do not take this medicine with any of the following medications: -live virus vaccines This medicine may also interact with the following medications: -cyclosporine -inactivated vaccines -warfarin This list may not describe all possible interactions. Give your health care provider a list of all the medicines, herbs, non-prescription drugs, or dietary supplements you use. Also tell them if you smoke, drink alcohol, or use illegal drugs. Some items may interact with your medicine. What should I watch for while using this medicine? Tell your doctor or healthcare professional if your symptoms do not start to get better or if they get worse. You will be tested for tuberculosis (TB) before you start this medicine. If your doctor prescribes any medicine for TB, you should start taking the TB medicine before starting this medicine. Make sure to finish the full course of TB medicine. Call your doctor or healthcare professional for advice if you get a fever, chills or sore throat, or other symptoms of a cold or flu. Do not treat yourself. This drug decreases your body's ability to fight infections. Try to avoid being around people who are sick. This medicine can decrease   the response to a vaccine. If you need to get vaccinated, tell your healthcare professional if you have received this medicine within the last 6 months. Extra booster doses may be needed. Talk to your doctor to see if a different vaccination schedule is needed. What side effects may I notice from  receiving this medicine? Side effects that you should report to your doctor or health care professional as soon as possible: -allergic reactions like skin rash, itching or hives, swelling of the face, lips, or tongue -signs and symptoms of infection like fever or chills; cough; sore throat; pain or trouble passing urine Side effects that usually do not require medical attention (report to your doctor or health care professional if they continue or are bothersome): -diarrhea This list may not describe all possible side effects. Call your doctor for medical advice about side effects. You may report side effects to FDA at 1-800-FDA-1088. Where should I keep my medicine? Keep out of the reach of children. Store the prefilled syringe or injection pen in a refrigerator between 2 to 8 degrees C (36 to 46 degrees F). Keep the syringe or the pen in the original carton until ready for use. Protect from light. Do not freeze. Do not shake. Prior to use, remove the syringe or pen from the refrigerator and use within 1 hour. Throw away any unused medicine after the expiration date on the label. NOTE: This sheet is a summary. It may not cover all possible information. If you have questions about this medicine, talk to your doctor, pharmacist, or health care provider.  2018 Elsevier/Gold Standard (2015-08-16 11:48:31)  

## 2017-01-07 NOTE — Progress Notes (Signed)
Pharmacy Note  Subjective:  Patient presents today to the [facility] to see Dr. Corliss Skainseveshwar.  Patient is currently taking Enbrel 50 mg weekly.  He has been on this medication since 2010.  Decision was made to change patient to Cosentyx.  Patient was seen by the pharmacist for counseling on Cosentyx.  Objective:  11/28/16 CBC, CMP normal TB Gold: negative (05/18/16) Hepatitis panel: negative (02/19/2009) HIV: ordered with next labs SPEP: normal (02/19/2009) Immunoglobulin: ordered with next labs  Assessment/Plan:  Counseled patient that Cosentyx is a IL-17 inhibitor that works to reduce pain and inflammation associated with arthritis.  Counseled patient on purpose, proper use, and adverse effects of Cosentyx. Reviewed the most common adverse effects of infection, inflammatory bowel disease, and allergic reaction.  Provided patient with medication education material and answered all questions.  Patient consented to Cosentyx.  Will upload consent into patient's chart.  Reviewed storage information for Cosentyx.  Advised patient that he will need a nursing visit for the initial Cosentyx injection.  Patient voiced understanding.  Patient took last Enbrel injection on Saturday, 01/03/17.  Advised Cosentyx cannot be started until one week after his most recent Enbrel.    We have applied for Cosentyx through patient's insurance.  We will update him once we have approval.  I assisted patient in signing up for Cosentyx coupon card (ID [id number], GRP [id number], BIN [id number], PCN [id number]).     Lilla Shookachel Syan Cullimore, Pharm.D., BCPS Clinical Pharmacist Pager: [phone] Phone: [phone] 01/07/2017 2:36 PM

## 2017-01-12 ENCOUNTER — Telehealth: Payer: Self-pay

## 2017-01-12 LAB — CBC WITH DIFFERENTIAL/PLATELET

## 2017-01-12 MED ORDER — SECUKINUMAB 150 MG/ML ~~LOC~~ SOAJ: 150.0000 mg | SUBCUTANEOUS | 0 refills | Status: DC

## 2017-01-12 NOTE — Telephone Encounter (Signed)
Informed patient of Cosentyx approval.  Prescription was sent to [facility] for delivery to clinic.  Patient reports most recent Enbrel injection was on Saturday, 01/10/17.  First Cosentyx injection must be at least one week after last Enbrel (earliest date of 01/19/17).  Will call patient once Cosentyx is delivered to clinic to set up nurse visit for initial injection.   Lilla Shookachel Yaa Donnellan, Pharm.D., BCPS, CPP Clinical Pharmacist Pager: [phone] Phone: [phone] 01/12/2017 4:10 PM

## 2017-01-12 NOTE — Telephone Encounter (Signed)
Received a fax from [facility] regarding a prior authorization approval for Cosentyx Loading dose from 01/08/17 to 02/06/17 and the maintenance dose form 01/30/17 to 07/01/17. Marylu LundJanet, a representative from [facility] states that the Rx must be filled at [facility]  [phone] phone, [phone] fax.   Reference number:[id number]  Phone [phone]  Will send document to scan center.  @[email or web address] @  1:51 PM

## 2017-01-14 ENCOUNTER — Telehealth: Payer: Self-pay | Admitting: Rheumatology

## 2017-01-14 NOTE — Telephone Encounter (Signed)
[facility] called stating that they are processing a refill for this patient and they are needing the license expiration date for Dr. Corliss Skainseveshwar.  CB#[phone] ext. [phone].  Thank you.

## 2017-01-15 NOTE — Telephone Encounter (Signed)
Attempted to return the call and left message for the [facility] to return the call. Dr. Fatima Sangereveshwar's license expiration is 01/04/2018.

## 2017-01-15 NOTE — Telephone Encounter (Signed)
Call was returned. The pharmacy has on file the Cosentyx and to dispense two pens instead og the original prescription written. Provided the correct prescription information.

## 2017-01-19 ENCOUNTER — Telehealth: Payer: Self-pay | Admitting: Pharmacist

## 2017-01-19 ENCOUNTER — Encounter: Payer: Self-pay | Admitting: Rheumatology

## 2017-01-19 NOTE — Telephone Encounter (Signed)
I called [facility] [phone]) to check on status of patient's prescription.  I spoke to Reuel BoomDaniel who confirmed prescription is ready and they just need to reach patient.  They tried to reach him Friday and left him a message.  She states there is a high copay.  Provided Reuel BoomDaniel with patient's copay card information.  We also discussed that initial prescription must be sent to clinic for clinic administration.  She verified clinic address.    I called patient and advised him to reach out to [facility] to set up medication delivery.  Patient confirms it has been over a week since his most recent Enbrel injection.  Nurse visit was scheduled for Wednesday, 01/21/17 at 2:00 PM for initial Cosentyx injection.    Lilla Shookachel Delainie Chavana, Pharm.D., BCPS, CPP Clinical Pharmacist Pager: [phone] Phone: [phone] 01/19/2017 10:26 AM

## 2017-01-20 ENCOUNTER — Telehealth: Payer: Self-pay

## 2017-01-20 NOTE — Telephone Encounter (Signed)
Received Cosentyx from pharmacy via mail. Medication has been placed in the refrigerator.   Hillis Mcphatter, [place], CPhT 9:13 AM

## 2017-01-21 ENCOUNTER — Ambulatory Visit (INDEPENDENT_AMBULATORY_CARE_PROVIDER_SITE_OTHER): Payer: BLUE CROSS/BLUE SHIELD | Admitting: *Deleted

## 2017-01-21 DIAGNOSIS — M0579 Rheumatoid arthritis with rheumatoid factor of multiple sites without organ or systems involvement: Secondary | ICD-10-CM | POA: Diagnosis not present

## 2017-01-21 DIAGNOSIS — M450 Ankylosing spondylitis of multiple sites in spine: Secondary | ICD-10-CM

## 2017-01-21 MED ORDER — CELECOXIB 200 MG PO CAPS: 200.0000 mg | ORAL_CAPSULE | Freq: Two times a day (BID) | ORAL | 0 refills | Status: DC | PRN

## 2017-01-21 MED ORDER — SECUKINUMAB 150 MG/ML ~~LOC~~ SOSY
150.0000 mg | PREFILLED_SYRINGE | Freq: Once | SUBCUTANEOUS | Status: AC
  Administered 2017-01-21: 150 mg via SUBCUTANEOUS

## 2017-01-21 NOTE — Progress Notes (Signed)
Pharmacy Note  Subjective:   Patient is being initiated on Cosentyx.  Patient was previously counseled extensively on Cosentyx on 01/07/17 and consented to initiation of Cosentyx at that time.  Patient presents to clinic today to receive the first dose of Cosentyx.  He confirms most recent Enbrel dose was greater than one week ago.  Patient is aware that Cosentyx will replace Enbrel.    Objective: CBC, CMP normal (11/28/16) TB Gold: negative (05/18/16)  Assessment/Plan:  Patient was counseled on how to administer subcutaneous Cosentyx injection using a demonstration pen.  Patient received first dose of Cosentyx.  Lot: [id number], Exp. 01/2018.  Patient was monitored for 30 minutes post injection.  No injection site reaction noted.  Patient will need standing lab orders in one month.  Provided patient with standing lab instructions and placed standing lab order.    Patient requests bridge therapy while waiting on Cosentyx to work.  Noted patient currently takes celecoxib 200 mg daily as needed.  Discussed with Dr. Corliss Skainseveshwar.  Will send in prescription for patient to take celecoxib 200 mg BID as needed. Counseled patient on purpose, proper use, and adverse effects of celecoxib.    Patient scheduled for follow up on 04/29/2017.    Lilla Shookachel Fonda Rochon, Pharm.D., BCPS Clinical Pharmacist Pager: [phone] Phone: [phone] 01/21/2017 2:18 PM

## 2017-01-21 NOTE — Patient Instructions (Addendum)
Take Cosentyx 150 mg subcutaneous injections weekly for week 0, 1, 2, 3, and 4 (today, 01/28/17, 02/04/17, 02/11/17, and 02/18/17) then every 4 weeks starting on 03/18/17  Standing Labs We placed an order today for your standing lab work.    Please get your standing labs in 1 month then every 3 months.  You will be due for TB Gold again in October 2018.  We have open lab Monday through Friday from 8:30-11:30 AM and 1:30-4 PM at the office of Dr. Arbutus PedShaili Deveshwar/Naitik Panwala, PA.   The office is located at [address], [address], [place], [place] [address] No appointment is necessary.   Labs are drawn by [facility].  You may receive a bill from [facility] for your lab work. If you have any questions regarding directions or hours of operation,  please call [phone].    Secukinumab injection What is this medicine? SECUKINUMAB (sek ue KIN ue mab) is used to treat psoriasis. It is also used to treat psoriatic arthritis and ankylosing spondylitis. This medicine may be used for other purposes; ask your health care provider or pharmacist if you have questions. COMMON BRAND NAME(S): Cosentyx What should I tell my health care provider before I take this medicine? They need to know if you have any of these conditions: -Crohn's disease, ulcerative colitis, or other inflammatory bowel disease -infection or history of infection -other conditions affecting the immune system -recently received or are scheduled to receive a vaccine -tuberculosis, a positive skin test for tuberculosis, or have recently been in close contact with someone who has tuberculosis -an unusual or allergic reaction to secukinumab, other medicines, latex, rubber, foods, dyes, or preservatives -pregnant or trying to get pregnant -breast-feeding How should I use this medicine? This medicine is for injection under the skin. It may be administered by a healthcare professional in a hospital or clinic setting or at home. If you get this  medicine at home, you will be taught how to prepare and give this medicine. Use exactly as directed. Take your medicine at regular intervals. Do not take your medicine more often than directed. It is important that you put your used needles and syringes in a special sharps container. Do not put them in a trash can. If you do not have a sharps container, call your pharmacist or healthcare provider to get one. A special MedGuide will be given to you by the pharmacist with each prescription and refill. Be sure to read this information carefully each time. Talk to your pediatrician regarding the use of this medicine in children. Special care may be needed. Overdosage: If you think you have taken too much of this medicine contact a poison control center or emergency room at once. NOTE: This medicine is only for you. Do not share this medicine with others. What if I miss a dose? It is important not to miss your dose. Call your doctor of health care professional if you are unable to keep an appointment. If you give yourself the medicine and you miss a dose, take it as soon as you can. If it is almost time for your next dose, take only that dose. Do not take double or extra doses. What may interact with this medicine? Do not take this medicine with any of the following medications: -live virus vaccines This medicine may also interact with the following medications: -cyclosporine -inactivated vaccines -warfarin This list may not describe all possible interactions. Give your health care provider a list of all the medicines, herbs,  non-prescription drugs, or dietary supplements you use. Also tell them if you smoke, drink alcohol, or use illegal drugs. Some items may interact with your medicine. What should I watch for while using this medicine? Tell your doctor or healthcare professional if your symptoms do not start to get better or if they get worse. You will be tested for tuberculosis (TB) before you start  this medicine. If your doctor prescribes any medicine for TB, you should start taking the TB medicine before starting this medicine. Make sure to finish the full course of TB medicine. Call your doctor or healthcare professional for advice if you get a fever, chills or sore throat, or other symptoms of a cold or flu. Do not treat yourself. This drug decreases your body's ability to fight infections. Try to avoid being around people who are sick. This medicine can decrease the response to a vaccine. If you need to get vaccinated, tell your healthcare professional if you have received this medicine within the last 6 months. Extra booster doses may be needed. Talk to your doctor to see if a different vaccination schedule is needed. What side effects may I notice from receiving this medicine? Side effects that you should report to your doctor or health care professional as soon as possible: -allergic reactions like skin rash, itching or hives, swelling of the face, lips, or tongue -signs and symptoms of infection like fever or chills; cough; sore throat; pain or trouble passing urine Side effects that usually do not require medical attention (report to your doctor or health care professional if they continue or are bothersome): -diarrhea This list may not describe all possible side effects. Call your doctor for medical advice about side effects. You may report side effects to FDA at 1-800-FDA-1088. Where should I keep my medicine? Keep out of the reach of children. Store the prefilled syringe or injection pen in a refrigerator between 2 to 8 degrees C (36 to 46 degrees F). Keep the syringe or the pen in the original carton until ready for use. Protect from light. Do not freeze. Do not shake. Prior to use, remove the syringe or pen from the refrigerator and use within 1 hour. Throw away any unused medicine after the expiration date on the label. NOTE: This sheet is a summary. It may not cover all possible  information. If you have questions about this medicine, talk to your doctor, pharmacist, or health care provider.  2018 Elsevier/Gold Standard (2015-08-16 11:48:31)

## 2017-01-21 NOTE — Progress Notes (Signed)
Patient in office for initial start to Cosentyx. Patient was given Cosentyx in Right thigh. Patient provided medication. Patient tolerated injection well. Patient was monitored in office for 30 minutes after administration for adverse reactions. No adverse reactions noted.   Administrations This Visit    Secukinumab SOSY 150 mg    Admin Date 01/21/2017 Action Given Dose 150 mg Route Subcutaneous Administered By Henriette CombsHatton, Amara Justen L, LPN

## 2017-02-02 ENCOUNTER — Encounter: Payer: Self-pay | Admitting: Rheumatology

## 2017-02-03 ENCOUNTER — Encounter: Payer: Self-pay | Admitting: Rheumatology

## 2017-02-03 ENCOUNTER — Telehealth: Payer: Self-pay | Admitting: *Deleted

## 2017-02-03 MED ORDER — PREDNISONE 5 MG PO TABS: ORAL_TABLET | ORAL | 0 refills | Status: DC

## 2017-02-03 NOTE — Telephone Encounter (Signed)
Okay to call in prescription for prednisone taper as prescribed earlier.

## 2017-02-03 NOTE — Telephone Encounter (Signed)
Per Dr. Corliss Skainseveshwar prescription for Prednisone taper sent to the pharmacy.

## 2017-02-06 ENCOUNTER — Telehealth: Payer: Self-pay | Admitting: Rheumatology

## 2017-02-06 MED ORDER — PREDNISONE 5 MG PO TABS: ORAL_TABLET | ORAL | 0 refills | Status: DC

## 2017-02-06 NOTE — Telephone Encounter (Signed)
Patient called stating that his prescription for his Prednisone needs to go to [facility] on [address].  It shows that it went to [facility].  Thank you.

## 2017-02-06 NOTE — Telephone Encounter (Signed)
Patient advised prescription has been sent to [facility] [address]. Patient reminded not to take Ibuprofen or Celebrex with prednisone. Patient verbalized understanding.

## 2017-02-09 ENCOUNTER — Telehealth: Payer: Self-pay | Admitting: Rheumatology

## 2017-02-09 NOTE — Telephone Encounter (Signed)
yes

## 2017-02-09 NOTE — Telephone Encounter (Signed)
Patient thinks his Cosentyx pens froze.  He has two more weeks of his loading dose remaining.  He has one pen at home and the pharmacy will be sending him week #5 but he is wondering if we can provide him with a sample?   Can we provide patient with a sample of Cosentyx 150 mg?

## 2017-02-09 NOTE — Telephone Encounter (Signed)
Patient left a message this morning stating he had some questions about his medication.  He stated that the mediation may have gotten slightly frozen.

## 2017-02-10 ENCOUNTER — Telehealth: Payer: Self-pay | Admitting: *Deleted

## 2017-02-10 NOTE — Telephone Encounter (Signed)
Medication Samples have been provided to the patient.  Drug name: Cosentyx       Strength: 150 mg       Qty: 1  LOT: [id number]  Exp.Date: 03/2017  Dosing instructions: Inject into skin on 02/11/17  The patient has been instructed regarding the correct time, dose, and frequency of taking this medication, including desired effects and most common side effects.   Dulcy Fannyndrea Hatton 3:46 PM 02/10/2017

## 2017-02-11 ENCOUNTER — Telehealth: Payer: Self-pay

## 2017-02-11 NOTE — Telephone Encounter (Signed)
Received a fax from [facility] stating that they need a prior authorization for patients' Cosentyx. Patient has an [place] on file for the loading dose from 01/08/17 through 02/06/17 and a maintenance dose from 01/30/17-07/01/2017. Patient started his loading dose on June 26th. The pharmacy filled his Rx for 4 pens for 28 days. He is still needing the last pen on 7/25. The LD auth has expired.   Will send document to scan center.  Called [facility] to see if we could get an extension. Spoke with Denyse AmassCorey who states that a new Berkley Harveyauth will have to be initiated. He submitted the auth over the phone. We should have an answer within 24-72 hours.   Reference number: [id number] Phone number: [phone]  Called Ilene at [facility] to update her on the process. Had to leave a message for her to call me back.  Phone number: [phone]  Will update once we receive a response.   Darcie Mellone, [place], CPhT  4:21 PM

## 2017-02-13 ENCOUNTER — Telehealth: Payer: Self-pay | Admitting: Pharmacist

## 2017-02-13 NOTE — Telephone Encounter (Signed)
I received a notification from [facility] stating the request for the medication (Cosentyx) has been previously approved and a current authorization exists.    I called [facility] and spoke to EdgardZakia.  She initially said that new prescription was needed.  I informed her patient was prescribed 5 pens for the loading dose and only received 4 pens.  He should still have one pen available on his prescription.  Wilkie AyeZakia did confirm he has one pen available.  She ran test claim which was successful.  She will call patient to set up delivery of Cosentyx.    Lilla Shookachel Trachelle Low, Pharm.D., BCPS, CPP Clinical Pharmacist Pager: [phone] Phone: [phone] 02/13/2017 4:14 PM

## 2017-02-17 ENCOUNTER — Encounter: Payer: Self-pay | Admitting: Rheumatology

## 2017-02-18 ENCOUNTER — Other Ambulatory Visit: Payer: Self-pay | Admitting: *Deleted

## 2017-02-18 MED ORDER — TRAMADOL HCL 50 MG PO TABS: 100.0000 mg | ORAL_TABLET | Freq: Three times a day (TID) | ORAL | 1 refills | Status: DC

## 2017-02-18 NOTE — Telephone Encounter (Signed)
I spoke with patient. He believes that his first shot of Cosyntex given in the office was good and after that the next 2 shots were frozen and were not effective. He believes that the reason he is not having good response to Cosyntex.Marland Kitchen. He was wondering if he can get 2 extra doses of Cosyntex once a week which I declined. I offered increasing prednisone dose to 20 mg a day for a month and then give him either next Cosyntex dose or switch him to anti-TNF. At this point he was to stay on Cosyntex. He does not want to increase his prednisone. He wants to stay on 15 mg a day for right now. He states in the beginning we gave him tramadol which was quite effective in controlling his pain symptoms and he would like to have tramadol again until Cosyntex to start working. Side effects were reviewed. I've advised him to come in the office to get a narcotic agreement signed. We will give him prescription for tramadol 50 mg 2 tablets by mouth 3 times a day when necessary total 30 day supply with one refill. Patient will come and pick up the prescription this afternoon.

## 2017-02-19 ENCOUNTER — Encounter: Payer: Self-pay | Admitting: Rheumatology

## 2017-02-20 ENCOUNTER — Other Ambulatory Visit: Payer: Self-pay | Admitting: *Deleted

## 2017-02-20 MED ORDER — PREDNISONE 5 MG PO TABS: ORAL_TABLET | ORAL | 0 refills | Status: DC

## 2017-02-20 NOTE — Telephone Encounter (Signed)
Prednisone 5 mg tablet 3  qam x1week, 2qAm x1wk, 11/2 qAM x1 wk, 1qAMx1 wk, 1/2 qAM x1wk

## 2017-03-05 ENCOUNTER — Encounter: Payer: Self-pay | Admitting: Rheumatology

## 2017-03-09 ENCOUNTER — Other Ambulatory Visit: Payer: Self-pay

## 2017-03-09 DIAGNOSIS — Z79899 Other long term (current) drug therapy: Secondary | ICD-10-CM

## 2017-03-09 LAB — CBC WITH DIFFERENTIAL/PLATELET
Basophils Absolute: 0 cells/uL (ref 0–200)
Basophils Relative: 0 %
Eosinophils Absolute: 0 cells/uL — ABNORMAL LOW (ref 15–500)
Eosinophils Relative: 0 %
HCT: 45.2 % (ref 38.5–50.0)
Hemoglobin: 15.1 g/dL (ref 13.2–17.1)
Lymphocytes Relative: 14 %
Lymphs Abs: 1484 cells/uL (ref 850–3900)
MCH: 31.7 pg (ref 27.0–33.0)
MCHC: 33.4 g/dL (ref 32.0–36.0)
MCV: 95 fL (ref 80.0–100.0)
MPV: 9.4 fL (ref 7.5–12.5)
Monocytes Absolute: 848 cells/uL (ref 200–950)
Monocytes Relative: 8 %
Neutro Abs: 8268 cells/uL — ABNORMAL HIGH (ref 1500–7800)
Neutrophils Relative %: 78 %
Platelets: 279 10*3/uL (ref 140–400)
RBC: 4.76 MIL/uL (ref 4.20–5.80)
RDW: 13 % (ref 11.0–15.0)
WBC: 10.6 10*3/uL (ref 3.8–10.8)

## 2017-03-09 LAB — COMPLETE METABOLIC PANEL WITH GFR
ALT: 18 U/L (ref 9–46)
AST: 18 U/L (ref 10–40)
Albumin: 4.4 g/dL (ref 3.6–5.1)
Alkaline Phosphatase: 47 U/L (ref 40–115)
BUN: 17 mg/dL (ref 7–25)
CO2: 25 mmol/L (ref 20–32)
Calcium: 9.5 mg/dL (ref 8.6–10.3)
Chloride: 99 mmol/L (ref 98–110)
Creat: 0.98 mg/dL (ref 0.60–1.35)
GFR, Est African American: >89 mL/min (ref >=60)
GFR, Est Non African American: >89 mL/min (ref >=60)
Glucose, Bld: 124 mg/dL — ABNORMAL HIGH (ref 65–99)
Potassium: 3.9 mmol/L (ref 3.5–5.3)
Sodium: 138 mmol/L (ref 135–146)
Total Bilirubin: 0.5 mg/dL (ref 0.2–1.2)
Total Protein: 6.9 g/dL (ref 6.1–8.1)

## 2017-03-10 NOTE — Progress Notes (Signed)
Within normal limits except mildly elevated glucose which was not fasting.

## 2017-03-11 ENCOUNTER — Telehealth: Payer: Self-pay | Admitting: Radiology

## 2017-03-11 MED ORDER — SECUKINUMAB 150 MG/ML ~~LOC~~ SOAJ: 150.0000 mg | SUBCUTANEOUS | 0 refills | Status: DC

## 2017-03-11 NOTE — Telephone Encounter (Signed)
Refill request received via fax for Cosentyx from [facility]

## 2017-03-11 NOTE — Telephone Encounter (Signed)
Patient has now had required labs they are normal Ok to refill per Dr Corliss Skainseveshwar

## 2017-03-23 ENCOUNTER — Other Ambulatory Visit: Payer: Self-pay | Admitting: Rheumatology

## 2017-04-16 NOTE — Progress Notes (Deleted)
Office Visit Note  Patient: Jon Henderson             Date of Birth: [birth year]           MRN: [id number]             PCP: [name], NP Referring: [name], NP Visit Date: 04/30/2017 Occupation: @    Subjective:  No chief complaint on file.   History of Present Illness: Jon Henderson is a 46 y.o. male ***   Activities of Daily Living:  Patient reports morning stiffness for *** {minute/hour:19697}.   Patient {ACTIONS;DENIES/REPORTS:21021675::"Denies"} nocturnal pain.  Difficulty dressing/grooming: {ACTIONS;DENIES/REPORTS:21021675::"Denies"} Difficulty climbing stairs: {ACTIONS;DENIES/REPORTS:21021675::"Denies"} Difficulty getting out of chair: {ACTIONS;DENIES/REPORTS:21021675::"Denies"} Difficulty using hands for taps, buttons, cutlery, and/or writing: {ACTIONS;DENIES/REPORTS:21021675::"Denies"}   No Rheumatology ROS completed.   PMFS History:  Patient Active Problem List   Diagnosis Date Noted  . Ankylosing spondylitis (HCC) 06/12/2016  . Sacroiliitis (HCC) 06/12/2016  . High risk medication use 06/12/2016  . ABNORMAL HEART RHYTHMS 04/20/2008  . DDD (degenerative disc disease), lumbar 04/20/2008  . COUGH 04/20/2008    Past Medical History:  Diagnosis Date  . Ankylosing spondylitis (HCC)   . Disc disorder of lumbar region     Family History  Problem Relation Age of Onset  . COPD Father   . Stroke Maternal Grandfather    Past Surgical History:  Procedure Laterality Date  . LUMBAR DISC SURGERY     Social History   Social History Narrative  . No narrative on file     Objective: Vital Signs: There were no vitals taken for this visit.   Physical Exam   Musculoskeletal Exam: ***  CDAI Exam: No CDAI exam completed.    Investigation: Findings:  TB Gold: negative (05/18/16) Hepatitis panel: negative (02/19/2009)   CBC Latest Ref Rng & Units 03/09/2017 01/07/2017  WBC 3.8 - 10.8 K/uL 10.6 TEST NOT PERFORMED  Hemoglobin  13.2 - 17.1 g/dL 14.7 TEST NOT PERFORMED  Hematocrit 38.5 - 50.0 % 45.2 TEST NOT PERFORMED  Platelets 140 - 400 K/uL 279 TEST NOT PERFORMED   CMP Latest Ref Rng & Units 03/09/2017 01/07/2017  Glucose 65 - 99 mg/dL 829(F) SEE NOTE  BUN 7 - 25 mg/dL 17 SEE NOTE  Creatinine 0.60 - 1.35 mg/dL 6.21 SEE NOTE  Sodium 308 - 146 mmol/L 138 SEE NOTE  Potassium 3.5 - 5.3 mmol/L 3.9 SEE NOTE  Chloride 98 - 110 mmol/L 99 SEE NOTE  CO2 20 - 32 mmol/L 25 SEE NOTE  Calcium 8.6 - 10.3 mg/dL 9.5 SEE NOTE  Total Protein 6.1 - 8.1 g/dL 6.9 SEE NOTE  Total Bilirubin 0.2 - 1.2 mg/dL 0.5 SEE NOTE  Alkaline Phos 40 - 115 U/L 47 SEE NOTE  AST 10 - 40 U/L 18 SEE NOTE  ALT 9 - 46 U/L 18 SEE NOTE   Imaging: No results found.  Speciality Comments: No specialty comments available.    Procedures:  No procedures performed Allergies: Patient has no known allergies.   Assessment / Plan:     Visit Diagnoses: Ankylosing spondylitis of sacral region (HCC)  Sacroiliitis (HCC)  High risk medication use  DDD (degenerative disc disease), lumbar    Orders: No orders of the defined types were placed in this encounter.  No orders of the defined types were placed in this encounter.   Face-to-face time spent with patient was *** minutes. 50% of time was spent in counseling and coordination of care.  Follow-Up Instructions: No Follow-up  on file.   Thatiana Renbarger, RT  Note - This record has been created using [employer].  Chart creation errors have been sought, but may not always  have been located. Such creation errors do not reflect on  the standard of medical care.

## 2017-04-17 ENCOUNTER — Other Ambulatory Visit: Payer: Self-pay | Admitting: Rheumatology

## 2017-04-17 NOTE — Telephone Encounter (Signed)
Last Visit: 01/07/17 Next Visit: 04/30/17 Labs: 03/09/17 Elevated glucose   Okay to refill per Dr. Corliss Skains

## 2017-04-28 ENCOUNTER — Encounter: Payer: Self-pay | Admitting: Rheumatology

## 2017-04-28 NOTE — Telephone Encounter (Signed)
Medication Samples have been provided to the patient.  Drug name: Cosentyx       Strength: 150 mg       Qty: 2  LOT: [id number]  Exp.Date: 11/2018  Dosing instructions: Inject 1 pen SQ every 30 days.   The patient has been instructed regarding the correct time, dose, and frequency of taking this medication, including desired effects and most common side effects.   Dulcy Fanny 4:05 PM 04/28/2017

## 2017-04-28 NOTE — Telephone Encounter (Signed)
Okay to give Cosyntex samples

## 2017-04-29 ENCOUNTER — Ambulatory Visit: Payer: BLUE CROSS/BLUE SHIELD | Admitting: Rheumatology

## 2017-04-30 ENCOUNTER — Ambulatory Visit: Payer: BLUE CROSS/BLUE SHIELD | Admitting: Rheumatology

## 2017-05-28 ENCOUNTER — Encounter: Payer: Self-pay | Admitting: Rheumatology

## 2017-05-28 NOTE — Telephone Encounter (Signed)
Patient has been scheduled for 06/02/17 at 9:00 am

## 2017-05-31 NOTE — Progress Notes (Signed)
Office Visit Note  Patient: Jon Henderson             Date of Birth: Sep 01, 1970           MRN: [id number]             PCP: [name], NP Referring: [name], NP Visit Date: 06/02/2017 Occupation: @GUAROCC @    Subjective:  Pain in hands.   History of Present Illness: Jon Henderson is a 46 y.o. male with history of enclosing spondylitis. He's been on Cosyntex for last 6 months. He states he continues to have flares. His last flare was last night in his left wrist. A flare before that was in his left hand. He states that the flares are bad enough that he cannot make a fist and they can happen anytime always working. He's been having stiffness in his C-spine. And some discomfort in the right SI joint. He denies any discomfort in his hips or knees. He has occasional discomfort in his feet. He believes the Cosyntex is not working as well as the Enbrel worked.  Activities of Daily Living:  Patient reports morning stiffness for 12 minutes.   Patient Reports nocturnal pain.  Difficulty dressing/grooming: Denies Difficulty climbing stairs: Denies Difficulty getting out of chair: Denies Difficulty using hands for taps, buttons, cutlery, and/or writing: Reports   Review of Systems  Constitutional: Positive for fatigue. Negative for night sweats and weakness.  HENT: Negative.  Negative for mouth sores, mouth dryness and nose dryness.   Eyes: Negative.  Negative for redness and dryness.  Respiratory: Negative.  Negative for shortness of breath and difficulty breathing.   Cardiovascular: Negative.  Negative for chest pain, palpitations, hypertension, irregular heartbeat and swelling in legs/feet.  Gastrointestinal: Negative.  Negative for constipation and diarrhea.  Endocrine: Negative for increased urination.  Genitourinary: Negative for urgency.  Musculoskeletal: Positive for arthralgias, joint pain, joint swelling and morning stiffness. Negative for myalgias, muscle  weakness, muscle tenderness and myalgias.  Skin: Negative.  Negative for color change, rash, hair loss, nodules/bumps, skin tightness, ulcers and sensitivity to sunlight.  Allergic/Immunologic: Negative for susceptible to infections.  Neurological: Negative for dizziness, fainting, numbness, headaches, memory loss and night sweats.  Hematological: Negative for swollen glands.  Psychiatric/Behavioral: Positive for sleep disturbance. Negative for depressed mood. The patient is not nervous/anxious.     PMFS History:  Patient Active Problem List   Diagnosis Date Noted  . Ankylosing spondylitis (HCC) 06/12/2016  . Sacroiliitis (HCC) 06/12/2016  . High risk medication use 06/12/2016  . ABNORMAL HEART RHYTHMS 04/20/2008  . DDD (degenerative disc disease), lumbar 04/20/2008  . COUGH 04/20/2008    Past Medical History:  Diagnosis Date  . Ankylosing spondylitis (HCC)   . Disc disorder of lumbar region     Family History  Problem Relation Age of Onset  . COPD Father   . Stroke Maternal Grandfather    Past Surgical History:  Procedure Laterality Date  . LUMBAR DISC SURGERY     Social History   Social History Narrative  . Not on file     Objective: Vital Signs: BP 126/81 (BP Location: Left Arm, Patient Position: Sitting, Cuff Size: Normal)   Pulse 78   Ht 6' (1.829 m)   Wt 192 lb (87.1 kg)   BMI 26.04 kg/m    Physical Exam  Constitutional: He is oriented to person, place, and time. He appears well-developed and well-nourished.  HENT:  Head: Normocephalic and atraumatic.  Eyes: Conjunctivae and  EOM are normal. Pupils are equal, round, and reactive to light.  Neck: Normal range of motion. Neck supple.  Cardiovascular: Normal rate, regular rhythm and normal heart sounds.  Pulmonary/Chest: Effort normal and breath sounds normal.  Abdominal: Soft. Bowel sounds are normal.  Neurological: He is alert and oriented to person, place, and time.  Skin: Skin is warm and dry. Capillary  refill takes less than 2 seconds.  Psychiatric: He has a normal mood and affect. His behavior is normal.  Nursing note and vitals reviewed.    Musculoskeletal Exam: C-spine stiffness with range of motion. Thoracic and lumbar spine good range of motion. He is some tenderness over right SI joint. Shoulder joints elbow joints wrist joints with good range of motion. He has some tenderness on palpation of her left wrist joint. PIP/DIP did not show any synovitis on examination. Hip joints knee joints ankles MTPs PIPs with good range of motion with no synovitis.  CDAI Exam: CDAI Homunculus Exam:   Tenderness:  LUE: wrist  Joint Counts:  CDAI Tender Joint count: 1 CDAI Swollen Joint count: 0  Global Assessments:  Patient Global Assessment: 6 Provider Global Assessment: 4  CDAI Calculated Score: 11    Investigation: No additional findings. CBC Latest Ref Rng & Units 03/09/2017 01/07/2017  WBC 3.8 - 10.8 K/uL 10.6 TEST NOT PERFORMED  Hemoglobin 13.2 - 17.1 g/dL 16.1 TEST NOT PERFORMED  Hematocrit 38.5 - 50.0 % 45.2 TEST NOT PERFORMED  Platelets 140 - 400 K/uL 279 TEST NOT PERFORMED   CMP Latest Ref Rng & Units 03/09/2017 01/07/2017  Glucose 65 - 99 mg/dL 096(E) SEE NOTE  BUN 7 - 25 mg/dL 17 SEE NOTE  Creatinine 0.60 - 1.35 mg/dL 4.54 SEE NOTE  Sodium 098 - 146 mmol/L 138 SEE NOTE  Potassium 3.5 - 5.3 mmol/L 3.9 SEE NOTE  Chloride 98 - 110 mmol/L 99 SEE NOTE  CO2 20 - 32 mmol/L 25 SEE NOTE  Calcium 8.6 - 10.3 mg/dL 9.5 SEE NOTE  Total Protein 6.1 - 8.1 g/dL 6.9 SEE NOTE  Total Bilirubin 0.2 - 1.2 mg/dL 0.5 SEE NOTE  Alkaline Phos 40 - 115 U/L 47 SEE NOTE  AST 10 - 40 U/L 18 SEE NOTE  ALT 9 - 46 U/L 18 SEE NOTE    Imaging: No results found.  Speciality Comments: No specialty comments available.    Procedures:  No procedures performed Allergies: Patient has no known allergies.   Assessment / Plan:     Visit Diagnoses: Ankylosing spondylitis of sacral region [facility]) -  Ultrasound examination of bilateral hands on 12/31/2016 showed bilateral hands synovitis. He continues to have frequent flares with increased swelling and pain in his hands and wrists joints. He states he had to severe flares in the last 1 week that he was unable to do his routine activities. He has failed Enbrel in the past. He has tried Cosyntex for more than 6 months now and still having flares. He believes Enbrel worked better than Cosyntex. We had detailed discussion regarding different treatment options and their side effects. After reviewing indications CONTRAINDICATIONS WERE DECIDED TO PROCEED WITH HUMIRA. WE WILL APPLY FOR HUMIRA. HANDOUT WAS GIVEN CONSENT WAS TAKEN.patient took his Cosyntex injection last week. He will have to wait a month before he can have his next Humira injection. We'll see where his approved we will schedule his appointment. He is taking Celebrex twice a day which is helping his symptoms as well. He is also modified his diet and continues  to do his exercises.  Medication counseling:     Does patient have diagnosis of heart failure?  No  Counseled patient that Humira is a TNF blocking agent.  Reviewed Humira dose of 40 mg every other week.  Counseled patient on purpose, proper use, and adverse effects of Humira.  Reviewed the most common adverse effects including infections, headache, and injection site reactions. Discussed that there is the possibility of an increased risk of malignancy but it is not well understood if this increased risk is due to the medication or the disease state.  Advised patient to get yearly dermatology exams due to risk of skin cancer.  Reviewed the importance of regular labs while on Humira therapy.  Advised patient to get standing labs one month after starting Humira then every 3 months.  Provided patient with standing lab orders.  Counseled patient that Humira should be held prior to scheduled surgery.  Counseled patient to avoid live vaccines while  on Humira.  Advised patient to get annual influenza vaccine and the pneumococcal vaccine as needed.  Provided patient with medication education material and answered all questions.  Patient voiced understanding.  Patient consented to Humira.  Will upload consent into the media tab.  Reviewed storage instructions of Humira.  Advised initial injection must be administered in office.    Patient voiced understanding.     Sacroiliitis [facility](HCC): He is some tenderness in the right SI joint.  Pain in both hands - Plan: Uric acid, Sedimentation rate  High risk medication use - Cosentyx q month, Celebrex 200 bid. Inadequate response to Enbrel.  - Plan: CBC with Differential/Platelet, COMPLETE METABOLIC PANEL WITH GFRtoday and every 3 months  DDD (degenerative disc disease), lumbar: Chronic pain  History of gastroesophageal reflux (GERD) - Controlled with medication    Orders: Orders Placed This Encounter  Procedures  . CBC with Differential/Platelet  . COMPLETE METABOLIC PANEL WITH GFR  . Uric acid  . Sedimentation rate   No orders of the defined types were placed in this encounter.   Face-to-face time spent with patient was 30   minutes. Greater than50% of time was spent in counseling and coordination of care.  Follow-Up Instructions: Return in about 3 months (around 09/02/2017) for Ankylosing spondylitis.   Pollyann SavoyShaili Demaree Liberto, MD  Note - This record has been created using [employer] software.  Chart creation errors have been sought, but may not always  have been located. Such creation errors do not reflect on  the standard of medical care.

## 2017-06-02 ENCOUNTER — Ambulatory Visit (INDEPENDENT_AMBULATORY_CARE_PROVIDER_SITE_OTHER): Payer: BLUE CROSS/BLUE SHIELD | Admitting: Rheumatology

## 2017-06-02 ENCOUNTER — Telehealth: Payer: Self-pay

## 2017-06-02 ENCOUNTER — Encounter: Payer: Self-pay | Admitting: Rheumatology

## 2017-06-02 VITALS — BP 126/81 | HR 78 | Ht 72.0 in | Wt 192.0 lb

## 2017-06-02 DIAGNOSIS — Z79899 Other long term (current) drug therapy: Secondary | ICD-10-CM

## 2017-06-02 DIAGNOSIS — M461 Sacroiliitis, not elsewhere classified: Secondary | ICD-10-CM | POA: Diagnosis not present

## 2017-06-02 DIAGNOSIS — Z8719 Personal history of other diseases of the digestive system: Secondary | ICD-10-CM

## 2017-06-02 DIAGNOSIS — M458 Ankylosing spondylitis sacral and sacrococcygeal region: Secondary | ICD-10-CM

## 2017-06-02 DIAGNOSIS — M79641 Pain in right hand: Secondary | ICD-10-CM

## 2017-06-02 DIAGNOSIS — M79642 Pain in left hand: Secondary | ICD-10-CM

## 2017-06-02 DIAGNOSIS — M5136 Other intervertebral disc degeneration, lumbar region: Secondary | ICD-10-CM

## 2017-06-02 NOTE — Telephone Encounter (Signed)
A prior authorization for HUMIRA was submitted to patient's insurance via cover my meds. Will update once we receive a response.   Maycen Degregory, [place]asta, CPhT 11:58 AM

## 2017-06-02 NOTE — Patient Instructions (Addendum)
Adalimumab Injection What is this medicine? ADALIMUMAB (a dal AYE mu mab) is used to treat rheumatoid and psoriatic arthritis. It is also used to treat ankylosing spondylitis, Crohn's disease, ulcerative colitis, plaque psoriasis, hidradenitis suppurativa, and uveitis. This medicine Stakes be used for other purposes; ask your health care provider or pharmacist if you have questions. COMMON BRAND NAME(S): CYLTEZO, Humira What should I tell my health care provider before I take this medicine? They need to know if you have any of these conditions: -diabetes -heart disease -hepatitis B or history of hepatitis B infection -immune system problems -infection or history of infections -multiple sclerosis -recently received or scheduled to receive a vaccine -scheduled to have surgery -tuberculosis, a positive skin test for tuberculosis or have recently been in close contact with someone who has tuberculosis -an unusual reaction to adalimumab, other medicines, mannitol, latex, rubber, foods, dyes, or preservatives -pregnant or trying to get pregnant -breast-feeding How should I use this medicine? This medicine is for injection under the skin. You will be taught how to prepare and give this medicine. Use exactly as directed. Take your medicine at regular intervals. Do not take your medicine more often than directed. A special MedGuide will be given to you by the pharmacist with each prescription and refill. Be sure to read this information carefully each time. It is important that you put your used needles and syringes in a special sharps container. Do not put them in a trash can. If you do not have a sharps container, call your pharmacist or healthcare provider to get one. Talk to your pediatrician regarding the use of this medicine in children. While this drug Harms be prescribed for children as young as 2 years for selected conditions, precautions do apply. The manufacturer of the medicine offers free  information to patients and their health care partners. Call 1-800-448-6472 for more information. Overdosage: If you think you have taken too much of this medicine contact a poison control center or emergency room at once. NOTE: This medicine is only for you. Do not share this medicine with others. What if I miss a dose? If you miss a dose, take it as soon as you can. If it is almost time for your next dose, take only that dose. Do not take double or extra doses. Give the next dose when your next scheduled dose is due. Call your doctor or health care professional if you are not sure how to handle a missed dose. What Franklyn interact with this medicine? Do not take this medicine with any of the following medications: -abatacept -anakinra -etanercept -infliximab -live virus vaccines -rilonacept This medicine Rallis also interact with the following medications: -vaccines This list Wolak not describe all possible interactions. Give your health care provider a list of all the medicines, herbs, non-prescription drugs, or dietary supplements you use. Also tell them if you smoke, drink alcohol, or use illegal drugs. Some items Hernandez interact with your medicine. What should I watch for while using this medicine? Visit your doctor or health care professional for regular checks on your progress. Tell your doctor or healthcare professional if your symptoms do not start to get better or if they get worse. You will be tested for tuberculosis (TB) before you start this medicine. If your doctor prescribes any medicine for TB, you should start taking the TB medicine before starting this medicine. Make sure to finish the full course of TB medicine. Call your doctor or health care professional if you get a cold   or other infection while receiving this medicine. Do not treat yourself. This medicine may decrease your body's ability to fight infection. Talk to your doctor about your risk of cancer. You may be more at risk for  certain types of cancers if you take this medicine. What side effects may I notice from receiving this medicine? Side effects that you should report to your doctor or health care professional as soon as possible: -allergic reactions like skin rash, itching or hives, swelling of the face, lips, or tongue -breathing problems -changes in vision -chest pain -fever, chills, or any other sign of infection -numbness or tingling -red, scaly patches or raised bumps on the skin -swelling of the ankles -swollen lymph nodes in the neck, underarm, or groin areas -unexplained weight loss -unusual bleeding or bruising -unusually weak or tired Side effects that usually do not require medical attention (report to your doctor or health care professional if they continue or are bothersome): -headache -nausea -redness, itching, swelling, or bruising at site where injected This list may not describe all possible side effects. Call your doctor for medical advice about side effects. You may report side effects to FDA at 1-800-FDA-1088. Where should I keep my medicine? Keep out of the reach of children. Store in the original container and in the refrigerator between 2 and 8 degrees C (36 and 46 degrees F). Do not freeze. The product may be stored in a cool carrier with an ice pack, if needed. Protect from light. Throw away any unused medicine after the expiration date. NOTE: This sheet is a summary. It may not cover all possible information. If you have questions about this medicine, talk to your doctor, pharmacist, or health care provider.  2018 Elsevier/Gold Standard (2015-01-31 11:11:43)  Standing Labs We placed an order today for your standing lab work.    Please come back and get your standing labs in February and every 3 months  We have open lab Monday through Friday from 8:30-11:30 AM and 1:30-4 PM at the office of Dr. Pollyann SavoyShaili Darwyn Ponzo.   The office is located at [address], [address],  [place], [place] [address] No appointment is necessary.   Labs are drawn by [facility].  You may receive a bill from [facility] for your lab work. If you have any questions regarding directions or hours of operation,  please call [phone].

## 2017-06-02 NOTE — Telephone Encounter (Signed)
Please apply for Humira.

## 2017-06-02 NOTE — Progress Notes (Signed)
Counseled patient that Humira is a TNF blocking agent.  Counseled patient on purpose, proper use, and adverse effects of Humira.  Reviewed the most common adverse effects including infections, headache, and injection site reactions. Discussed that there is the possibility of an increased risk of malignancy but it is not well understood if this increased risk is due to the medication or the disease state.  Advised patient to get yearly dermatology exams due to risk of skin cancer.  Reviewed the importance of regular labs while on Humira therapy.  Counseled patient that Humira should be held prior to scheduled surgery.  Counseled patient to avoid live vaccines while on Humira.  Advised patient to get annual influenza vaccine and the pneumococcal vaccine as indicated.  Provided patient with medication education material and answered all questions.  Patient consented to Humira.  Will upload consent into the media tab.  Reviewed storage instructions of Humira.  Advised initial injection must be administered in office.

## 2017-06-03 LAB — URIC ACID: Uric Acid, Serum: 4.8 mg/dL (ref 4.0–8.0)

## 2017-06-03 LAB — COMPLETE METABOLIC PANEL WITH GFR
AG Ratio: 1.6 (calc) (ref 1.0–2.5)
ALT: 19 U/L (ref 9–46)
AST: 21 U/L (ref 10–40)
Albumin: 4.4 g/dL (ref 3.6–5.1)
Alkaline phosphatase (APISO): 53 U/L (ref 40–115)
BUN: 16 mg/dL (ref 7–25)
CO2: 28 mmol/L (ref 20–32)
Calcium: 9.6 mg/dL (ref 8.6–10.3)
Chloride: 101 mmol/L (ref 98–110)
Creat: 0.92 mg/dL (ref 0.60–1.35)
GFR, Est African American: 115 mL/min/{1.73_m2} (ref >=60)
GFR, Est Non African American: 99 mL/min/{1.73_m2} (ref >=60)
Globulin: 2.8 g/dL (calc) (ref 1.9–3.7)
Glucose, Bld: 99 mg/dL (ref 65–99)
Potassium: 4.8 mmol/L (ref 3.5–5.3)
Sodium: 137 mmol/L (ref 135–146)
Total Bilirubin: 0.5 mg/dL (ref 0.2–1.2)
Total Protein: 7.2 g/dL (ref 6.1–8.1)

## 2017-06-03 LAB — CBC WITH DIFFERENTIAL/PLATELET
Basophils Absolute: 39 cells/uL (ref 0–200)
Basophils Relative: 0.7 %
Eosinophils Absolute: 28 cells/uL (ref 15–500)
Eosinophils Relative: 0.5 %
HCT: 46.6 % (ref 38.5–50.0)
Hemoglobin: 15.9 g/dL (ref 13.2–17.1)
Lymphs Abs: 750 cells/uL — ABNORMAL LOW (ref 850–3900)
MCH: 31 pg (ref 27.0–33.0)
MCHC: 34.1 g/dL (ref 32.0–36.0)
MCV: 90.8 fL (ref 80.0–100.0)
MPV: 9.5 fL (ref 7.5–12.5)
Monocytes Relative: 11.6 %
Neutro Abs: 4133 cells/uL (ref 1500–7800)
Neutrophils Relative %: 73.8 %
Platelets: 259 10*3/uL (ref 140–400)
RBC: 5.13 10*6/uL (ref 4.20–5.80)
RDW: 12.1 % (ref 11.0–15.0)
Total Lymphocyte: 13.4 %
WBC mixed population: 650 cells/uL (ref 200–950)
WBC: 5.6 10*3/uL (ref 3.8–10.8)

## 2017-06-03 LAB — SEDIMENTATION RATE: Sed Rate: 2 mm/h (ref 0–15)

## 2017-06-03 MED ORDER — ADALIMUMAB 40 MG/0.4ML ~~LOC~~ PSKT: 40.0000 mg | PREFILLED_SYRINGE | SUBCUTANEOUS | 0 refills | Status: DC

## 2017-06-03 NOTE — Telephone Encounter (Signed)
Prescription sent to pharmacy. Will notify patient and schedule nurse visit once received in office.

## 2017-06-03 NOTE — Progress Notes (Signed)
Within normal limits

## 2017-06-03 NOTE — Addendum Note (Signed)
Addended by: Henriette CombsHATTON, Chae Shuster L on: 06/03/2017 04:25 PM   Modules accepted: Orders

## 2017-06-03 NOTE — Telephone Encounter (Signed)
[facility] a prior authorization approval for HUMIRA from 06/02/17 to 11/29/2017.   Reference number:[id number] Phone number: [phone]  Will send document to scan center.  Called patient to update. We will send his Rx to [facility]. His first fill will come to the clinic for his initial dose and then they will be sent to his home thereafter. Once we receive the Rx, we will contact him to schedule a nursing visit. Patient voices understanding and denies any questions at this time.   Please send a new Rx to his specialty pharmacy. Thanks!  Berl Bonfanti, [place]asta, CPhT 3:22 PM

## 2017-06-10 ENCOUNTER — Telehealth: Payer: Self-pay

## 2017-06-10 NOTE — Telephone Encounter (Signed)
Received Humira from by mail from patients pharmacy. Medication has bee stored in the refrigerator.   Please schedule nursing visit. Patient must wait one month after his last Cosentyx injection before he can start Humira. Thanks!  Quinzell Malcomb, [place], CPhT 11:09 AM

## 2017-06-11 NOTE — Telephone Encounter (Signed)
Patient scheduled for 06/24/17 at 2:00 pm for new start Humira appointment.

## 2017-06-24 ENCOUNTER — Ambulatory Visit: Payer: Self-pay

## 2017-06-26 ENCOUNTER — Ambulatory Visit (INDEPENDENT_AMBULATORY_CARE_PROVIDER_SITE_OTHER): Payer: BLUE CROSS/BLUE SHIELD | Admitting: *Deleted

## 2017-06-26 VITALS — BP 136/79 | HR 71

## 2017-06-26 DIAGNOSIS — M458 Ankylosing spondylitis sacral and sacrococcygeal region: Secondary | ICD-10-CM

## 2017-06-26 MED ORDER — ADALIMUMAB 40 MG/0.4ML ~~LOC~~ PSKT
40.0000 mg | PREFILLED_SYRINGE | Freq: Once | SUBCUTANEOUS | Status: AC
  Administered 2017-06-26: 40 mg via SUBCUTANEOUS

## 2017-06-26 NOTE — Progress Notes (Signed)
Patient in office for a new start to Humira. Patient is switching from Cosentyx to Humira. It has been a month since patient's last Cosentyx. Patient provided medications. Patient was given a demonstration on proper administration and was able to demonstrate proper technique for administration. Patient was given injection in right arm. Patient tolerated injection well. Patient monitored in office for 30 minutes after administration. No adverse reaction noted.   Administrations This Visit    Adalimumab PSKT 40 mg    Admin Date 06/26/2017 Action Given Dose 40 mg Route Subcutaneous Administered By Henriette CombsHatton, Kailey Esquilin L, LPN

## 2017-06-26 NOTE — Patient Instructions (Signed)
Standing Labs We placed an order today for your standing lab work.    Please come back and get your standing labs in 1 month and every 3 months.   We have open lab Monday through Friday from 8:30-11:30 AM and 1:30-4 PM at the office of Dr. Shaili Deveshwar.   The office is located at [address], [address], [place], [place] [address] No appointment is necessary.   Labs are drawn by [facility].  You may receive a bill from [facility] for your lab work. If you have any questions regarding directions or hours of operation,  please call [phone].     

## 2017-07-01 ENCOUNTER — Other Ambulatory Visit: Payer: Self-pay | Admitting: *Deleted

## 2017-07-01 MED ORDER — CELECOXIB 200 MG PO CAPS: 200.0000 mg | ORAL_CAPSULE | Freq: Two times a day (BID) | ORAL | 0 refills | Status: DC | PRN

## 2017-07-01 NOTE — Telephone Encounter (Signed)
Refill request received via fax.   Last Visit: 06/02/17 Next Visit: 09/17/17 Labs: 06/02/17 WNL  Okay to refill per Dr. Corliss Skainseveshwar

## 2017-07-13 ENCOUNTER — Telehealth: Payer: Self-pay

## 2017-07-13 NOTE — Telephone Encounter (Signed)
Received a fax from [facility] stating that they have received pts rx for Humira and is scheduled for delivery on 07/23/2017.   Will update once received.   Aedyn Kempfer, [place], CPhT 2:59 PM

## 2017-08-04 NOTE — Telephone Encounter (Signed)
Never received prescription. Will you call on this please.

## 2017-08-04 NOTE — Telephone Encounter (Signed)
Medication was received on 06/10/2017 and pt had his nursing visit on 06/26/17.  *see previous telephone notes* Thanks!  Darvell Monteforte 11:36 AM

## 2017-09-04 NOTE — Progress Notes (Signed)
Office Visit Note  Patient: Jon Henderson             Date of Birth: 08-06-1970           MRN: [id number]             PCP: [name], NP Referring: [name], NP Visit Date: 09/17/2017 Occupation: @GUAROCC @    Subjective:  Joint stiffness.   History of Present Illness: Jon Henderson is a 47 y.o. male with history of ankylosing spondylitis.  He is doing much better on Humira which she started in December.  He states he is also modified his diet which has been very helpful.  He has been having some stiffness in his joints with no joint swelling.  He denies any SI joint pain.  He does have some lower back pain from underlying disc disease.  Activities of Daily Living:  Patient reports morning stiffness for 45 minutes.   Patient Denies nocturnal pain.  Difficulty dressing/grooming: Denies Difficulty climbing stairs: Denies Difficulty getting out of chair: Denies Difficulty using hands for taps, buttons, cutlery, and/or writing: Denies   Review of Systems  Constitutional: Positive for fatigue. Negative for night sweats and weakness ( ).  HENT: Negative for mouth sores, mouth dryness and nose dryness.   Eyes: Negative for redness and dryness.  Respiratory: Negative for shortness of breath and difficulty breathing.   Cardiovascular: Negative for chest pain, palpitations, hypertension, irregular heartbeat and swelling in legs/feet.  Gastrointestinal: Negative for constipation and diarrhea.  Endocrine: Negative for increased urination.  Musculoskeletal: Positive for morning stiffness. Negative for joint swelling, myalgias, muscle weakness, muscle tenderness and myalgias.  Skin: Negative for color change, rash, hair loss, nodules/bumps, skin tightness, ulcers and sensitivity to sunlight.  Allergic/Immunologic: Negative for susceptible to infections.  Neurological: Negative for dizziness, fainting, memory loss and night sweats.  Hematological: Negative for swollen  glands.  Psychiatric/Behavioral: Positive for sleep disturbance. Negative for depressed mood. The patient is not nervous/anxious.     PMFS History:  Patient Active Problem List   Diagnosis Date Noted  . Ankylosing spondylitis (HCC) 06/12/2016  . Sacroiliitis (HCC) 06/12/2016  . High risk medication use 06/12/2016  . ABNORMAL HEART RHYTHMS 04/20/2008  . DDD (degenerative disc disease), lumbar 04/20/2008  . COUGH 04/20/2008    Past Medical History:  Diagnosis Date  . Ankylosing spondylitis (HCC)   . Disc disorder of lumbar region     Family History  Problem Relation Age of Onset  . COPD Father   . Stroke Maternal Grandfather    Past Surgical History:  Procedure Laterality Date  . LUMBAR DISC SURGERY     Social History   Social History Narrative  . Not on file     Objective: Vital Signs: BP (!) 129/95 (BP Location: Left Arm, Patient Position: Sitting, Cuff Size: Large)   Pulse 85   Resp 14   Wt 189 lb (85.7 kg)   BMI 25.63 kg/m    Physical Exam  Constitutional: He is oriented to person, place, and time. He appears well-developed and well-nourished.  HENT:  Head: Normocephalic and atraumatic.  Eyes: Conjunctivae and EOM are normal. Pupils are equal, round, and reactive to light.  Neck: Normal range of motion. Neck supple.  Cardiovascular: Normal rate, regular rhythm and normal heart sounds.  Pulmonary/Chest: Effort normal and breath sounds normal.  Abdominal: Soft. Bowel sounds are normal.  Neurological: He is alert and oriented to person, place, and time.  Skin: Skin is warm and  dry. Capillary refill takes less than 2 seconds.  Psychiatric: He has a normal mood and affect. His behavior is normal.  Nursing note and vitals reviewed.    Musculoskeletal Exam: C-spine thoracic spine good range of motion.  He has some discomfort range of motion of his lumbar spine.  Shoulder joints elbow joints wrist joints MCPs PIPs DIPs with good range of motion with no synovitis.   Hip joints knee joints ankles MTPs PIPs with good range of motion with no synovitis.  CDAI Exam: CDAI Homunculus Exam:   Joint Counts:  CDAI Tender Joint count: 0 CDAI Swollen Joint count: 0  Global Assessments:  Patient Global Assessment: 2 Provider Global Assessment: 2  CDAI Calculated Score: 4    Investigation: No additional findings. CBC Latest Ref Rng & Units 09/15/2017 06/02/2017 03/09/2017  WBC 3.8 - 10.8 Thousand/uL 7.6 5.6 10.6  Hemoglobin 13.2 - 17.1 g/dL 16.114.8 09.615.9 04.515.1  Hematocrit 38.5 - 50.0 % 42.9 46.6 45.2  Platelets 140 - 400 Thousand/uL 270 259 279   CMP Latest Ref Rng & Units 09/15/2017 06/02/2017 03/09/2017  Glucose 65 - 99 mg/dL 93 99 409(W124(H)  BUN 7 - 25 mg/dL 22 16 17   Creatinine 0.60 - 1.35 mg/dL 1.191.09 1.470.92 8.290.98  Sodium 135 - 146 mmol/L 138 137 138  Potassium 3.5 - 5.3 mmol/L 4.4 4.8 3.9  Chloride 98 - 110 mmol/L 101 101 99  CO2 20 - 32 mmol/L 28 28 25   Calcium 8.6 - 10.3 mg/dL 9.6 9.6 9.5  Total Protein 6.1 - 8.1 g/dL 7.0 7.2 6.9  Total Bilirubin 0.2 - 1.2 mg/dL 0.6 0.5 0.5  Alkaline Phos 40 - 115 U/L - - 47  AST 10 - 40 U/L 19 21 18   ALT 9 - 46 U/L 15 19 18   September 15, 2017 CBC normal, CMP normal, TB Gold pending  Imaging: No results found.  Speciality Comments: No specialty comments available.    Procedures:  No procedures performed Allergies: Patient has no known allergies.   Assessment / Plan:     Visit Diagnoses: Ankylosing spondylitis of sacral region [facility](HCC) -he is clinically doing much better on Humira.  He had no synovitis on examination today.  Sacroiliitis (HCC): Resolved.  High risk medication use - Humira restarted December 2018, celebrex prn, tramadol prn (Inadequate response to Enbrel, cosentyx) his labs are normal we will continue to monitor labs every 3 months.  DDD (degenerative disc disease), lumbar: Chronic discomfort.  History of gastroesophageal reflux (GERD) - Controlled with medication    Orders: No orders of the  defined types were placed in this encounter.  No orders of the defined types were placed in this encounter.   Face-to-face time spent with patient was 20 minutes.  Greater than 50% of time was spent in counseling and coordination of care.  Follow-Up Instructions: Return in about 5 months (around 02/14/2018) for Ankylosing spondylitis, DDD.   Pollyann SavoyShaili Deveshwar, MD  Note - This record has been created using Animal nutritionistDragon software.  Chart creation errors have been sought, but may not always  have been located. Such creation errors do not reflect on  the standard of medical care.

## 2017-09-07 ENCOUNTER — Other Ambulatory Visit: Payer: Self-pay | Admitting: Rheumatology

## 2017-09-07 DIAGNOSIS — Z9225 Personal history of immunosupression therapy: Secondary | ICD-10-CM

## 2017-09-07 DIAGNOSIS — Z79899 Other long term (current) drug therapy: Secondary | ICD-10-CM

## 2017-09-08 NOTE — Telephone Encounter (Signed)
Last Visit: 06/02/17 Next Visit: 09/17/17 Labs: 06/02/17  TB Gold: 05/22/16 Neg   Left message to advise patient he is due to update labs  Okay to refill 30 day supply per Dr. Corliss Skainseveshwar

## 2017-09-15 ENCOUNTER — Other Ambulatory Visit: Payer: Self-pay

## 2017-09-15 DIAGNOSIS — Z79899 Other long term (current) drug therapy: Secondary | ICD-10-CM

## 2017-09-15 DIAGNOSIS — Z9225 Personal history of immunosupression therapy: Secondary | ICD-10-CM

## 2017-09-16 NOTE — Progress Notes (Signed)
WNLs

## 2017-09-17 ENCOUNTER — Ambulatory Visit (INDEPENDENT_AMBULATORY_CARE_PROVIDER_SITE_OTHER): Payer: BLUE CROSS/BLUE SHIELD | Admitting: Rheumatology

## 2017-09-17 ENCOUNTER — Encounter: Payer: Self-pay | Admitting: Rheumatology

## 2017-09-17 VITALS — BP 129/95 | HR 85 | Resp 14 | Wt 189.0 lb

## 2017-09-17 DIAGNOSIS — M458 Ankylosing spondylitis sacral and sacrococcygeal region: Secondary | ICD-10-CM | POA: Diagnosis not present

## 2017-09-17 DIAGNOSIS — M5136 Other intervertebral disc degeneration, lumbar region: Secondary | ICD-10-CM | POA: Diagnosis not present

## 2017-09-17 DIAGNOSIS — Z79899 Other long term (current) drug therapy: Secondary | ICD-10-CM

## 2017-09-17 DIAGNOSIS — M461 Sacroiliitis, not elsewhere classified: Secondary | ICD-10-CM | POA: Diagnosis not present

## 2017-09-17 DIAGNOSIS — Z8719 Personal history of other diseases of the digestive system: Secondary | ICD-10-CM | POA: Diagnosis not present

## 2017-09-17 LAB — QUANTIFERON-TB GOLD PLUS
Mitogen-NIL: >10 IU/mL
NIL: 0.04 IU/mL
QuantiFERON-TB Gold Plus: NEGATIVE
TB1-NIL: <0 IU/mL
TB2-NIL: 0 IU/mL

## 2017-09-17 LAB — CBC WITH DIFFERENTIAL/PLATELET
Basophils Absolute: 38 cells/uL (ref 0–200)
Basophils Relative: 0.5 %
Eosinophils Absolute: 30 cells/uL (ref 15–500)
Eosinophils Relative: 0.4 %
HCT: 42.9 % (ref 38.5–50.0)
Hemoglobin: 14.8 g/dL (ref 13.2–17.1)
Lymphs Abs: 2242 cells/uL (ref 850–3900)
MCH: 31 pg (ref 27.0–33.0)
MCHC: 34.5 g/dL (ref 32.0–36.0)
MCV: 89.9 fL (ref 80.0–100.0)
MPV: 9.7 fL (ref 7.5–12.5)
Monocytes Relative: 13.8 %
Neutro Abs: 4241 cells/uL (ref 1500–7800)
Neutrophils Relative %: 55.8 %
Platelets: 270 10*3/uL (ref 140–400)
RBC: 4.77 10*6/uL (ref 4.20–5.80)
RDW: 12.3 % (ref 11.0–15.0)
Total Lymphocyte: 29.5 %
WBC mixed population: 1049 cells/uL — ABNORMAL HIGH (ref 200–950)
WBC: 7.6 10*3/uL (ref 3.8–10.8)

## 2017-09-17 LAB — COMPLETE METABOLIC PANEL WITH GFR
AG Ratio: 1.7 (calc) (ref 1.0–2.5)
ALT: 15 U/L (ref 9–46)
AST: 19 U/L (ref 10–40)
Albumin: 4.4 g/dL (ref 3.6–5.1)
Alkaline phosphatase (APISO): 45 U/L (ref 40–115)
BUN: 22 mg/dL (ref 7–25)
CO2: 28 mmol/L (ref 20–32)
Calcium: 9.6 mg/dL (ref 8.6–10.3)
Chloride: 101 mmol/L (ref 98–110)
Creat: 1.09 mg/dL (ref 0.60–1.35)
GFR, Est African American: 94 mL/min/{1.73_m2} (ref >=60)
GFR, Est Non African American: 81 mL/min/{1.73_m2} (ref >=60)
Globulin: 2.6 g/dL (calc) (ref 1.9–3.7)
Glucose, Bld: 93 mg/dL (ref 65–99)
Potassium: 4.4 mmol/L (ref 3.5–5.3)
Sodium: 138 mmol/L (ref 135–146)
Total Bilirubin: 0.6 mg/dL (ref 0.2–1.2)
Total Protein: 7 g/dL (ref 6.1–8.1)

## 2017-09-17 NOTE — Patient Instructions (Signed)
Standing Labs We placed an order today for your standing lab work.    Please come back and get your standing labs in May and every 3 months  We have open lab Monday through Friday from 8:30-11:30 AM and 1:30-4 PM at the office of Dr. Kelleen Stolze.   The office is located at [address], [address], [place], [place] [address] No appointment is necessary.   Labs are drawn by [facility].  You may receive a bill from [facility] for your lab work. If you have any questions regarding directions or hours of operation,  please call [phone].    

## 2017-09-29 ENCOUNTER — Other Ambulatory Visit: Payer: Self-pay | Admitting: Rheumatology

## 2017-09-30 NOTE — Telephone Encounter (Signed)
Last Visit: 09/17/17 Next Visit: 02/18/18 Labs: 09/15/17 WNL TB Gold: 09/15/17 Neg   Okay to refill per Dr. Corliss Skainseveshwar

## 2017-11-11 ENCOUNTER — Encounter: Payer: Self-pay | Admitting: Rheumatology

## 2017-11-12 ENCOUNTER — Telehealth: Payer: Self-pay

## 2017-11-12 NOTE — Telephone Encounter (Signed)
Medication Samples have been provided to the patient.  Drug name: Humira      Strength: 40mg /0.454mL        Qty: 1 pen  LOT: [id number]  Exp.Date: 10/2018  Dosing instructions: Inject 40mg  into the skin, every other week.   The patient has been instructed regarding the correct time, dose, and frequency of taking this medication, including desired effects and most common side effects.   Maryclare BeanMarissa C Jourdain Guay 12:07 PM 11/12/2017

## 2017-11-23 ENCOUNTER — Encounter: Payer: Self-pay | Admitting: Rheumatology

## 2017-11-24 ENCOUNTER — Telehealth: Payer: Self-pay

## 2017-11-24 NOTE — Telephone Encounter (Signed)
Responded to pt's email

## 2017-11-24 NOTE — Telephone Encounter (Signed)
A prior authorization for Humira /0.52ml has been submitted to pts new insurance via cover my meds as an urgent request. We may need to give him another sample to get him through since he is receiving it by mail.   Will update once we have a response.   Kanasia Gayman, [place], CPhT 8:22 AM

## 2017-11-24 NOTE — Telephone Encounter (Signed)
Received a fax from [facility] regarding a prior authorization approval for HUMIRA /0.4ML from 11/24/2017 to 05/25/2018.   Reference number: [id number] Phone number: [phone]  Will send document to scan center.  Called pt to update. Did not get an answer, mailbox is full. Will send message to pts [facility], [place], CPhT 2:03 PM

## 2017-11-24 NOTE — Telephone Encounter (Signed)
Yes we may provide with a sample

## 2017-11-26 ENCOUNTER — Telehealth: Payer: Self-pay

## 2017-11-26 NOTE — Telephone Encounter (Signed)
Medication Samples have been provided to the patient.  Drug name: Humira       Strength: /0.75mL       Qty: 1 pen  LOT: [id number]  Exp.Date:10/2018  Dosing instructions: Inject 1 pen into the skin every other week.   The patient has been instructed regarding the correct time, dose, and frequency of taking this medication, including desired effects and most common side effects.   Jon Henderson Jon Henderson 12:00 PM 11/26/2017

## 2017-11-27 ENCOUNTER — Encounter: Payer: Self-pay | Admitting: Rheumatology

## 2017-11-30 ENCOUNTER — Encounter: Payer: Self-pay | Admitting: Rheumatology

## 2017-11-30 MED ORDER — ADALIMUMAB 40 MG/0.4ML ~~LOC~~ AJKT: 40.0000 mg | AUTO-INJECTOR | SUBCUTANEOUS | 0 refills | Status: DC

## 2017-11-30 NOTE — Telephone Encounter (Signed)
Please send in a new Rx for the pens. Thanks

## 2017-12-16 ENCOUNTER — Encounter: Payer: Self-pay | Admitting: Rheumatology

## 2017-12-16 NOTE — Progress Notes (Signed)
Office Visit Note  Patient: Jon Henderson             Date of Birth: [birth year]           MRN: [id number]             PCP: [name], NP Referring: [name], NP Visit Date: 12/17/2017 Occupation: @    Subjective:  Weakness (Severe decrease in energy x 2 months)   History of Present Illness: Jon Henderson is a 47 y.o. male with history of ankylosing spondylitis and DDD.  He states for the last 2 months he has been having increased fatigue.  He believes his muscles are weaker than usual.  He has also had some flares with increased pain and discomfort in his right hip joint.  He believes that Humira is not working as well.  He has not had any joint swelling.  He has been having increased pain in the lower thoracic region.  He states after prolonged standing he started experiencing some tingling sensation in that area.  He had labs done at his PCPs office yesterday for evaluation.  The results are pending at this time.  He started a prednisone taper last Tuesday starting at 60 mg .  Patient has not noticed any improvement since he started taking prednisone.  He is concerned about tick bite .  He has not noticed any tick on himself.  Activities of Daily Living:  Patient reports morning stiffness for 45 minutes.   Patient Denies nocturnal pain.  Difficulty dressing/grooming: Denies Difficulty climbing stairs: Reports Difficulty getting out of chair: REPORTS Difficulty using hands for taps, buttons, cutlery, and/or writing: Reports   Review of Systems  Constitutional: Positive for fatigue. Negative for activity change and night sweats.  HENT: Positive for mouth dryness. Negative for mouth sores and nose dryness.   Eyes: Negative for redness and dryness.  Respiratory: Positive for shortness of breath. Negative for difficulty breathing.   Cardiovascular: Negative for chest pain, palpitations, hypertension, irregular heartbeat and swelling in legs/feet.    Gastrointestinal: Positive for constipation and diarrhea.  Endocrine: Negative for excessive thirst and increased urination.  Genitourinary: Negative for difficulty urinating.  Musculoskeletal: Positive for arthralgias, joint pain, muscle weakness and morning stiffness. Negative for gait problem, joint swelling, myalgias, muscle tenderness and myalgias.  Skin: Negative for color change, rash, hair loss, nodules/bumps, skin tightness, ulcers and sensitivity to sunlight.  Allergic/Immunologic: Negative for susceptible to infections.  Neurological: Positive for numbness. Negative for dizziness, fainting, light-headedness, memory loss, night sweats and weakness.  Hematological: Negative for bruising/bleeding tendency and swollen glands.  Psychiatric/Behavioral: Positive for sleep disturbance. Negative for depressed mood. The patient is not nervous/anxious.     PMFS History:  Patient Active Problem List   Diagnosis Date Noted  . Ankylosing spondylitis (HCC) 06/12/2016  . Sacroiliitis (HCC) 06/12/2016  . High risk medication use 06/12/2016  . ABNORMAL HEART RHYTHMS 04/20/2008  . DDD (degenerative disc disease), lumbar 04/20/2008  . COUGH 04/20/2008    Past Medical History:  Diagnosis Date  . Ankylosing spondylitis (HCC)   . Disc disorder of lumbar region     Family History  Problem Relation Age of Onset  . COPD Father   . Stroke Maternal Grandfather    Past Surgical History:  Procedure Laterality Date  . LUMBAR DISC SURGERY     Social History   Social History Narrative  . Not on file     Objective: Vital Signs: BP 111/65 (BP Location: Left  Arm, Patient Position: Sitting, Cuff Size: Normal)   Pulse 89   Resp 14   Ht 6' (1.829 m)   Wt 192 lb (87.1 kg)   BMI 26.04 kg/m    Physical Exam  Constitutional: He is oriented to person, place, and time. He appears well-developed and well-nourished.  HENT:  Head: Normocephalic and atraumatic.  Eyes: Pupils are equal, round, and  reactive to light. Conjunctivae and EOM are normal.  Neck: Normal range of motion. Neck supple.  Cardiovascular: Normal rate, regular rhythm and normal heart sounds.  Pulmonary/Chest: Effort normal and breath sounds normal.  Abdominal: Soft. Bowel sounds are normal.  Neurological: He is alert and oriented to person, place, and time.  Muscle strength 4+/5 in upper extremities and lower extremities.  Skin: Skin is warm and dry. Capillary refill takes less than 2 seconds.  Psychiatric: He has a normal mood and affect. His behavior is normal.  Nursing note and vitals reviewed.    Musculoskeletal Exam: C-spine good range of motion.  He has some point tenderness over T7-T8 region.  Lumbar spine with good range of motion.  He had no SI joint tenderness.  Good mobility in his lumbar spine.  Shoulder joints elbow joints wrist joint MCPs PIPs DIPs were in good range of motion with no synovitis.  Hip joints knee joints ankles MTPs PIPs in good range of motion with no synovitis.  CDAI Exam: No CDAI exam completed.    Investigation: No additional findings.Tb gold: 09/15/2017 Negative  CBC Latest Ref Rng & Units 09/15/2017 06/02/2017 03/09/2017  WBC 3.8 - 10.8 Thousand/uL 7.6 5.6 10.6  Hemoglobin 13.2 - 17.1 g/dL 04.5 40.9 81.1  Hematocrit 38.5 - 50.0 % 42.9 46.6 45.2  Platelets 140 - 400 Thousand/uL 270 259 279   CMP Latest Ref Rng & Units 09/15/2017 06/02/2017 03/09/2017  Glucose 65 - 99 mg/dL 93 99 914(N)  BUN 7 - 25 mg/dL Creatinine 0.60 - 1.35 mg/dL 8.29 5.62 1.30  Sodium 135 - 146 mmol/L 138 137 138  Potassium 3.5 - 5.3 mmol/L 4.4 4.8 3.9  Chloride 98 - 110 mmol/L 101 101 99  CO2 20 - 32 mmol/L Calcium 8.6 - 10.3 mg/dL 9.6 9.6 9.5  Total Protein 6.1 - 8.1 g/dL 7.0 7.2 6.9  Total Bilirubin 0.2 - 1.2 mg/dL 0.6 0.5 0.5  Alkaline Phos 40 - 115 U/L - - 47  AST 10 - 40 U/L ALT 9 - 46 U/L Imaging: Xr Thoracic Spine 2 View  Result Date:  12/17/2017 Multilevel spondylosis with prominent osteophytes between T7-T8, T8-T9 and T9-T10 were noted.  Some wedging of vertebrae were also noted which raises concern of possible thoracic vertebral fractures.   Speciality Comments: No specialty comments available.    Procedures:  No procedures performed Allergies: Patient has no known allergies.   Assessment / Plan:     Visit Diagnoses: Ankylosing spondylitis of sacral region (HCC)-his ankylosing spondylitis seems to be quite well controlled.  He had no SI joint tenderness and no synovitis on examination.  Sacroiliitis (HCC)-he had no SI joint tenderness on exam.  High risk medication use - Humira restarted December 2018, celebrex prn, tramadol prn (Inadequate response to Enbrel, cosentyx).  His labs have been stable.  He had some labs done yesterday by his PCP which are pending at this point.  I have suggested following labs which include CBC, CMP, UA, sed rate, CK,  TSH, vitamin D, testosterone, SPEP, ANA.  Pain in thoracic spine -patient complains of intermittent severe thoracic pain with paresthesias in the thoracic region.  Plan: XR Thoracic Spine 2 View obtained today revealed multilevel spondylosis with severe osteophytes from T7-T9.  He appears to have some wedging of his vertebrae as well.  I will obtain MRI of his thoracic spine to evaluate this further.  He is currently on prednisone taper by his PCP.  I have suggested obtaining vitamin D SPEP and testosterone levels.  DDD (degenerative disc disease), lumbar-chronic pain currently he is not having much discomfort in his lower back.  Other fatigue-he has been experiencing increased fatigue for the last 2 months.  He believes that the fatigue is progressive.  He did have some muscle weakness in his upper and lower extremities today.  He had labs done by his PCP and I would like to review those.  I have requested to add CK to his labs which were obtained yesterday.  History of  gastroesophageal reflux (GERD) - Controlled with medication     Orders: Orders Placed This Encounter  Procedures  . XR Thoracic Spine 2 View  . MR THORACIC SPINE WO CONTRAST   No orders of the defined types were placed in this encounter.   Face-to-face time spent with patient was 40 minutes. >50% of time was spent in counseling and coordination of care.  Follow-Up Instructions: Return in about 2 weeks (around 12/31/2017) for Ankylosing spondylitis.   Pollyann Savoy, MD  Note - This record has been created using Animal nutritionist.  Chart creation errors have been sought, but may not always  have been located. Such creation errors do not reflect on  the standard of medical care.

## 2017-12-17 ENCOUNTER — Ambulatory Visit (INDEPENDENT_AMBULATORY_CARE_PROVIDER_SITE_OTHER): Payer: BLUE CROSS/BLUE SHIELD | Admitting: Rheumatology

## 2017-12-17 ENCOUNTER — Ambulatory Visit (INDEPENDENT_AMBULATORY_CARE_PROVIDER_SITE_OTHER): Payer: Self-pay

## 2017-12-17 ENCOUNTER — Encounter: Payer: Self-pay | Admitting: Rheumatology

## 2017-12-17 VITALS — BP 111/65 | HR 89 | Resp 14 | Ht 72.0 in | Wt 192.0 lb

## 2017-12-17 DIAGNOSIS — M461 Sacroiliitis, not elsewhere classified: Secondary | ICD-10-CM

## 2017-12-17 DIAGNOSIS — M546 Pain in thoracic spine: Secondary | ICD-10-CM

## 2017-12-17 DIAGNOSIS — M791 Myalgia, unspecified site: Secondary | ICD-10-CM

## 2017-12-17 DIAGNOSIS — Z8719 Personal history of other diseases of the digestive system: Secondary | ICD-10-CM | POA: Diagnosis not present

## 2017-12-17 DIAGNOSIS — Z79899 Other long term (current) drug therapy: Secondary | ICD-10-CM

## 2017-12-17 DIAGNOSIS — M458 Ankylosing spondylitis sacral and sacrococcygeal region: Secondary | ICD-10-CM | POA: Diagnosis not present

## 2017-12-17 DIAGNOSIS — M5136 Other intervertebral disc degeneration, lumbar region: Secondary | ICD-10-CM

## 2017-12-17 DIAGNOSIS — R5383 Other fatigue: Secondary | ICD-10-CM | POA: Diagnosis not present

## 2017-12-18 ENCOUNTER — Encounter: Payer: Self-pay | Admitting: Rheumatology

## 2017-12-22 NOTE — Telephone Encounter (Signed)
I spoke with the [facility].  We will wait on the lab results.  Please check on the MRI of thoracic spine which was requested last week.

## 2017-12-23 ENCOUNTER — Encounter: Payer: Self-pay | Admitting: Rheumatology

## 2017-12-23 NOTE — Progress Notes (Signed)
Office Visit Note  Patient: Jon Henderson             Date of Birth: May 11, 1971           MRN: [id number]             PCP: [name], NP Referring: [name], NP Visit Date: 12/31/2017 Occupation: @    Subjective:  Right arm weakness.   History of Present Illness: Jon Henderson is a 47 y.o. male with history of ankylosing spondylitis and DDD.  He was seen recently for bilateral upper extremity discomfort generalized fatigue and thoracic pain.  He has severe osteophytes in the thoracic region and severe discomfort.  The MRI of the thoracic spine was unremarkable.  He states his thoracic pain persists but is not as severe now.  He is been doing some stretching exercises.  Although he is quite frustrated with the decrease in strength in his right upper arm.  He states he is unable to contract his right biceps muscle.  He is unable to lift objects at work.  Activities of Daily Living:  Patient reports morning stiffness for 24 hours.   Patient Reports nocturnal pain.  Difficulty dressing/grooming: Denies Difficulty climbing stairs: Denies Difficulty getting out of chair: Denies Difficulty using hands for taps, buttons, cutlery, and/or writing: Reports   Review of Systems  Constitutional: Positive for fatigue.  HENT: Negative for mouth sores and mouth dryness.   Eyes: Negative for dryness.  Respiratory: Negative for shortness of breath and difficulty breathing.   Cardiovascular: Negative for chest pain and swelling in legs/feet.  Gastrointestinal: Negative for abdominal pain, constipation and diarrhea.  Endocrine: Negative for increased urination.  Genitourinary: Negative for pelvic pain.  Musculoskeletal: Positive for arthralgias, joint pain, joint swelling, myalgias, morning stiffness and myalgias.  Skin: Negative for rash and hair loss.  Neurological: Positive for numbness and weakness. Negative for memory loss.  Hematological: Negative for  bruising/bleeding tendency.  Psychiatric/Behavioral: Negative for confusion.    PMFS History:  Patient Active Problem List   Diagnosis Date Noted  . Ankylosing spondylitis (HCC) 06/12/2016  . Sacroiliitis (HCC) 06/12/2016  . High risk medication use 06/12/2016  . ABNORMAL HEART RHYTHMS 04/20/2008  . DDD (degenerative disc disease), lumbar 04/20/2008  . COUGH 04/20/2008    Past Medical History:  Diagnosis Date  . Ankylosing spondylitis (HCC)   . Disc disorder of lumbar region     Family History  Problem Relation Age of Onset  . Ankylosing spondylitis Mother   . COPD Father   . Drug abuse Sister   . Alcohol abuse Sister   . Stroke Maternal Grandfather   . Healthy Daughter   . ADD / ADHD Daughter   . Healthy Daughter   . ADD / ADHD Daughter   . Healthy Daughter    Past Surgical History:  Procedure Laterality Date  . LUMBAR DISC SURGERY     Social History   Social History Narrative  . Not on file     Objective: Vital Signs: BP 130/83 (BP Location: Left Arm, Patient Position: Sitting, Cuff Size: Normal)   Pulse 76   Resp 13   Ht 6' (1.829 m)   Wt 193 lb (87.5 kg)   BMI 26.18 kg/m    Physical Exam  Constitutional: He is oriented to person, place, and time. He appears well-developed and well-nourished.  HENT:  Head: Normocephalic and atraumatic.  Eyes: Pupils are equal, round, and reactive to light. Conjunctivae and EOM are normal.  Neck: Normal range of motion. Neck supple.  Cardiovascular: Normal rate, regular rhythm and normal heart sounds.  Pulmonary/Chest: Effort normal and breath sounds normal.  Abdominal: Soft. Bowel sounds are normal.  Neurological: He is alert and oriented to person, place, and time.    strength in right upper extremity 4/5.  He appears to have right biceps atrophy which is smaller than his left biceps.  Skin: Skin is warm and dry. Capillary refill takes less than 2 seconds.  Psychiatric: He has a normal mood and affect. His behavior  is normal.  Nursing note and vitals reviewed.    Musculoskeletal Exam: C-spine good range of motion without any discomfort.  He continues to have some midthoracic pain.  He has some discomfort with lumbar spine range of motion.  Shoulder joints elbow joints wrist joints are good range of motion.  He has no DIP PIP synovitis.  He has a right upper extremity decreased strength 4+/5.  Biceps atrophy was mentioned as above.  Hip joints, knee joints, ankles and MTPs PIPs are good range of motion with no synovitis.  He has minimal SI joint tenderness.  CDAI Exam: No CDAI exam completed.    Investigation: No additional findings.TB Gold: 09/15/2017 Negative  CBC Latest Ref Rng & Units 09/15/2017 06/02/2017 03/09/2017  WBC 3.8 - 10.8 Thousand/uL 7.6 5.6 10.6  Hemoglobin 13.2 - 17.1 g/dL 40.9 81.1 91.4  Hematocrit 38.5 - 50.0 % 42.9 46.6 45.2  Platelets 140 - 400 Thousand/uL 270 259 279   CMP Latest Ref Rng & Units 09/15/2017 06/02/2017 03/09/2017  Glucose 65 - 99 mg/dL 93 99 782(N)  BUN 7 - 25 mg/dL Creatinine 0.60 - 1.35 mg/dL 5.62 1.30 8.65  Sodium 135 - 146 mmol/L 138 137 138  Potassium 3.5 - 5.3 mmol/L 4.4 4.8 3.9  Chloride 98 - 110 mmol/L 101 101 99  CO2 20 - 32 mmol/L Calcium 8.6 - 10.3 mg/dL 9.6 9.6 9.5  Total Protein 6.1 - 8.1 g/dL 7.0 7.2 6.9  Total Bilirubin 0.2 - 1.2 mg/dL 0.6 0.5 0.5  Alkaline Phos 40 - 115 U/L - - 47  AST 10 - 40 U/L ALT 9 - 46 U/L Imaging: Mr Thoracic Spine Wo Contrast  Result Date: 12/29/2017 CLINICAL DATA:  Severe thoracic back pain. Continued surveillance. Abnormal plain films. EXAM: MRI THORACIC SPINE WITHOUT CONTRAST TECHNIQUE: Multiplanar, multisequence MR imaging of the thoracic spine was performed. No intravenous contrast was administered. COMPARISON:  Plain films 12/17/2017. FINDINGS: Alignment:  Anatomic Vertebrae: No fracture, evidence of discitis, or bone lesion. Cord:  Normal signal and morphology. Paraspinal  and other soft tissues: Unremarkable. Disc levels: The individual disc spaces are examined as follows: T1-2:  Normal. T2-3:  Normal. T3-4:  Normal. T4-5:  Normal. T5-6:  Tiny leftward protrusion.  No impingement. T6-7:  Tiny RIGHT paracentral protrusion.  No impingement. T7-8: Tiny leftward protrusion. Facet arthropathy. No impingement. T8-9:  Tiny leftward protrusion.  No impingement. T9-10:  Annular bulge.  Facet arthropathy.  No impingement. T10-11: Tiny rightward protrusion. Facet arthropathy. No impingement. T11-12:  Annular bulge.  Facet arthropathy.  No impingement. T12-L1: Unremarkable. IMPRESSION: Minor thoracic spondylosis, with scattered levels demonstrating small or insignificant disc protrusions, along with midthoracic facet arthropathy. No compression fracture bone marrow edema, foraminal narrowing, or significant spinal stenosis. No intrinsic cord abnormality or intraspinal space-occupying lesion. Paravertebral soft tissues are within normal limits. Electronically Signed  By: Elsie Stain M.D.   On: 12/29/2017 15:20   Xr Thoracic Spine 2 View  Result Date: 12/17/2017 Multilevel spondylosis with prominent osteophytes between T7-T8, T8-T9 and T9-T10 were noted.  Some wedging of vertebrae were also noted which raises concern of possible thoracic vertebral fractures.   Speciality Comments: No specialty comments available.    Procedures:  No procedures performed Allergies: Patient has no known allergies.   Assessment / Plan:     Visit Diagnoses: Ankylosing spondylitis of sacral region (HCC)-his ankylosing spondylitis appears to be doing well.  He had no synovitis on examination.  He continues to have some SI joint discomfort.  Sacroiliitis (HCC)-he has some SI joint tenderness today.  High risk medication use - Humira restarted December 2018, celebrex prn, tramadol prn (Inadequate response to Enbrel, cosentyx).  His labs have been stable.  We will continue to monitor labs every 3  months.  I have advised him to hold Humira until he has neurological evaluation.  We had discussion regarding possible neurological side effects from Humira as well.  Atrophy of muscle of right upper arm-he had to weakness in his right upper extremity with muscle strength 4+/5.  His right biceps appears to be atrophied compared to the left biceps.  He also has decreased strength in his right upper extremity.  I also had Dr. Cleophas Dunker in the room today to examine patient.  He does not think his symptoms are coming from cervical spine.  His shoulder joint was unremarkable on exam.  He has appointment with Dr. Lucia Gaskins next Tuesday.  I would like her input.  DDD (degenerative disc disease), thoracic  DDD (degenerative disc disease), lumbar  Other fatigue  Myalgia  History of gastroesophageal reflux (GERD)    Orders: No orders of the defined types were placed in this encounter.  No orders of the defined types were placed in this encounter.   Face-to-face time spent with patient was 30 minutes.> 50% of time was spent in counseling and coordination of care.  Follow-Up Instructions: Return in about 1 month (around 01/28/2018) for AS, OA, DD.   Pollyann Savoy, MD  Note - This record has been created using Animal nutritionist.  Chart creation errors have been sought, but may not always  have been located. Such creation errors do not reflect on  the standard of medical care.

## 2017-12-25 ENCOUNTER — Encounter: Payer: Self-pay | Admitting: Rheumatology

## 2017-12-29 ENCOUNTER — Ambulatory Visit (HOSPITAL_COMMUNITY)
Admission: RE | Admit: 2017-12-29 | Discharge: 2017-12-29 | Disposition: A | Discharge status: Home / Self Care | Payer: BLUE CROSS/BLUE SHIELD | Source: Ambulatory Visit | Attending: Rheumatology | Admitting: Rheumatology

## 2017-12-29 DIAGNOSIS — M546 Pain in thoracic spine: Secondary | ICD-10-CM | POA: Diagnosis present

## 2017-12-29 DIAGNOSIS — M5124 Other intervertebral disc displacement, thoracic region: Secondary | ICD-10-CM | POA: Insufficient documentation

## 2017-12-29 DIAGNOSIS — M1288 Other specific arthropathies, not elsewhere classified, other specified site: Secondary | ICD-10-CM | POA: Diagnosis not present

## 2017-12-29 DIAGNOSIS — M47814 Spondylosis without myelopathy or radiculopathy, thoracic region: Secondary | ICD-10-CM | POA: Insufficient documentation

## 2017-12-29 NOTE — Progress Notes (Signed)
Findings were discussed with the patient.  Please refer him to neurology for evaluation of her extremity weakness.

## 2017-12-31 ENCOUNTER — Encounter: Payer: Self-pay | Admitting: Rheumatology

## 2017-12-31 ENCOUNTER — Ambulatory Visit (INDEPENDENT_AMBULATORY_CARE_PROVIDER_SITE_OTHER): Payer: BLUE CROSS/BLUE SHIELD | Admitting: Rheumatology

## 2017-12-31 ENCOUNTER — Telehealth: Payer: Self-pay | Admitting: *Deleted

## 2017-12-31 VITALS — BP 130/83 | HR 76 | Resp 13 | Ht 72.0 in | Wt 193.0 lb

## 2017-12-31 DIAGNOSIS — Z8719 Personal history of other diseases of the digestive system: Secondary | ICD-10-CM | POA: Diagnosis not present

## 2017-12-31 DIAGNOSIS — M791 Myalgia, unspecified site: Secondary | ICD-10-CM | POA: Diagnosis not present

## 2017-12-31 DIAGNOSIS — M5134 Other intervertebral disc degeneration, thoracic region: Secondary | ICD-10-CM

## 2017-12-31 DIAGNOSIS — M5136 Other intervertebral disc degeneration, lumbar region: Secondary | ICD-10-CM | POA: Diagnosis not present

## 2017-12-31 DIAGNOSIS — R5383 Other fatigue: Secondary | ICD-10-CM

## 2017-12-31 DIAGNOSIS — M461 Sacroiliitis, not elsewhere classified: Secondary | ICD-10-CM | POA: Diagnosis not present

## 2017-12-31 DIAGNOSIS — M458 Ankylosing spondylitis sacral and sacrococcygeal region: Secondary | ICD-10-CM | POA: Diagnosis not present

## 2017-12-31 DIAGNOSIS — M62521 Muscle wasting and atrophy, not elsewhere classified, right upper arm: Secondary | ICD-10-CM

## 2017-12-31 DIAGNOSIS — R29898 Other symptoms and signs involving the musculoskeletal system: Secondary | ICD-10-CM

## 2017-12-31 DIAGNOSIS — Z79899 Other long term (current) drug therapy: Secondary | ICD-10-CM | POA: Diagnosis not present

## 2017-12-31 NOTE — Telephone Encounter (Signed)
-----   Message from Pollyann SavoyShaili Deveshwar, MD sent at 12/29/2017  4:43 PM EDT ----- Findings were discussed with the patient.  Please refer him to neurology for evaluation of her extremity weakness.

## 2018-01-05 ENCOUNTER — Ambulatory Visit (INDEPENDENT_AMBULATORY_CARE_PROVIDER_SITE_OTHER): Payer: BLUE CROSS/BLUE SHIELD | Admitting: Neurology

## 2018-01-05 ENCOUNTER — Telehealth: Payer: Self-pay | Admitting: Neurology

## 2018-01-05 ENCOUNTER — Encounter: Payer: Self-pay | Admitting: Neurology

## 2018-01-05 VITALS — BP 133/85 | HR 72 | Ht 72.0 in | Wt 194.0 lb

## 2018-01-05 DIAGNOSIS — M62521 Muscle wasting and atrophy, not elsewhere classified, right upper arm: Secondary | ICD-10-CM | POA: Diagnosis not present

## 2018-01-05 DIAGNOSIS — Z79899 Other long term (current) drug therapy: Secondary | ICD-10-CM | POA: Diagnosis not present

## 2018-01-05 DIAGNOSIS — M450 Ankylosing spondylitis of multiple sites in spine: Secondary | ICD-10-CM

## 2018-01-05 DIAGNOSIS — D8989 Other specified disorders involving the immune mechanism, not elsewhere classified: Secondary | ICD-10-CM | POA: Diagnosis not present

## 2018-01-05 DIAGNOSIS — M5412 Radiculopathy, cervical region: Secondary | ICD-10-CM

## 2018-01-05 DIAGNOSIS — R531 Weakness: Secondary | ICD-10-CM

## 2018-01-05 DIAGNOSIS — R29818 Other symptoms and signs involving the nervous system: Secondary | ICD-10-CM

## 2018-01-05 NOTE — Progress Notes (Addendum)
[facility]    Provider:  Dr Lucia Gaskins Referring Provider: Melvenia Needles Primary Care Physician:  Melvenia Needles  CC:  Upper extremity weakness  HPI:  Jon Henderson is a 47 y.o. male here as a referral for upper extremity weakness.  He has a past medical history of ankylosing spondylitis and degenerative disc disease.  Reviewed referring physician's notes. No significant neck pain but he does occasionally have neck "twinge" and radiating pain down the left arm. His right arm has beginning of May, he has felt something wrong for the last few months, he has been trying to sleep beter and eat better. He has 5 kids. They all went to [place] and he was able to pull the kayak out of the water in April so absolutely no weakness in April. After that he felt weak in the arms, fatigue, digits 3-5 on the right hand are numb for a week, positional ove rthe last week. Tingling starts in the upper arm and radiates to his thumb but also runs down the ulnar side to dig 5. He has to shake his right arm out sometimes bc it feels numb. The other arm is less affected. No inciting event but did start 2 weeks after his [place] trip, he can;t do as many push ups at all can do 16 used to 60, can;t contract the right biceps 5 pounds max in 3 weeks. He feels weakness sin the right biceps. No facial weakness or swallowing difficulty, there is muscle wasting in the the right biceps and that is the focus of the weakness.  He has been trying to treat it with exercise, OTC meds, stretching under the care of Dr. Corliss Skains, rapidly progressive. He does feel fatigue in the legs and hamstrings but no weakness just fatigue sooner.   Reviewed notes, labs and imaging from outside physicians, which showed:  He was seen recently for bilateral upper extremity discomfort, generalized fatigue and thoracic pain.  He has severe osteophytes in the thoracic region and severe discomfort.  MRI of the thoracic spine was  unremarkable.  His thoracic pain improved not as severe.  He is doing some stretching exercises.  He is quite frustrated with the decrease in strength in his right upper arm.  Unable to contract his right biceps muscle and unable to lift objects at work.  On exam strength in the right upper extremity was 4 out of 5 throughout and he does appear to have right biceps atrophy as compared to his left biceps.  She consulted with Dr. Cleophas Dunker at [facility] who did not think symptoms are coming from the cervical spine.  MRI thoracic spine: personally reviewed images and agree: IMPRESSION: Minor thoracic spondylosis, with scattered levels demonstrating small or insignificant disc protrusions, along with midthoracic facet arthropathy.  No compression fracture bone marrow edema, foraminal narrowing, or significant spinal stenosis.  No intrinsic cord abnormality or intraspinal space-occupying lesion.  Paravertebral soft tissues are within normal limits.  Review of Systems: Patient complains of symptoms per HPI as well as the following symptoms: weakness, depression, anxiety, blurred vision. Pertinent negatives and positives per HPI. All others negative.   Social History   Socioeconomic History  . Marital status: Single    Spouse name: Not on file  . Number of children: Not on file  . Years of education: Not on file  . Highest education level: Not on file  Occupational History  . Not on file  Social Needs  . Financial resource strain:  Not on file  . Food insecurity:    Worry: Not on file    Inability: Not on file  . Transportation needs:    Medical: Not on file    Non-medical: Not on file  Tobacco Use  . Smoking status: Former Smoker    Packs/day: 1.00    Years: 10.00    Pack years: 10.00    Types: Cigarettes    Last attempt to quit: 06/24/2001    Years since quitting: 16.5  . Smokeless tobacco: Former Neurosurgeon    Types: Snuff    Quit date: 2002  Substance and Sexual Activity    . Alcohol use: Yes    Alcohol/week: 4.8 oz    Types: 7 Standard drinks or equivalent, 1 Glasses of wine per week    Comment: occasionally  . Drug use: No  . Sexual activity: Yes  Lifestyle  . Physical activity:    Days per week: Not on file    Minutes per session: Not on file  . Stress: Not on file  Relationships  . Social connections:    Talks on phone: Not on file    Gets together: Not on file    Attends religious service: Not on file    Active member of club or organization: Not on file    Attends meetings of clubs or organizations: Not on file    Relationship status: Not on file  . Intimate partner violence:    Fear of current or ex partner: Not on file    Emotionally abused: Not on file    Physically abused: Not on file    Forced sexual activity: Not on file  Other Topics Concern  . Not on file  Social History Narrative  . Not on file    Family History  Problem Relation Age of Onset  . Ankylosing spondylitis Mother   . COPD Father   . Drug abuse Sister   . Alcohol abuse Sister   . Stroke Maternal Grandfather   . Healthy Daughter   . ADD / ADHD Daughter   . Healthy Daughter   . ADD / ADHD Daughter   . Healthy Daughter     Past Medical History:  Diagnosis Date  . Ankylosing spondylitis (HCC)   . Disc disorder of lumbar region     Past Surgical History:  Procedure Laterality Date  . LUMBAR DISC SURGERY      Current Outpatient Medications  Medication Sig Dispense Refill  . Adalimumab (HUMIRA PEN) 40 MG/0.4ML PNKT Inject 40 mg into the skin every 14 (fourteen) days. 6 each 0  . albuterol (PROAIR HFA) 108 (90 Base) MCG/ACT inhaler ProAir HFA 90 mcg/actuation aerosol inhaler  Inhale 2 puffs every 4 hours by inhalation route as needed for 30 days.    . celecoxib (CELEBREX) 200 MG capsule Take 1 capsule (200 mg total) by mouth 2 (two) times daily as needed. 180 capsule 0  . doxycycline (VIBRA-TABS) 100 MG tablet 2 (two) times daily.    Marland Kitchen HUMIRA 40 MG/0.4ML  PSKT INJECT 40MG  SUBCUTANEOUSLY ONCE EVERY OTHER WEEK AS DIRECTED. 6 each 0  . mometasone (NASONEX) 50 MCG/ACT nasal spray Place 2 sprays into the nose daily as needed.    . Multiple Vitamin (MULTIVITAMIN) tablet Take 1 tablet by mouth as needed.     . pantoprazole (PROTONIX) 40 MG tablet     . predniSONE (DELTASONE) 10 MG tablet     . zolpidem (AMBIEN) 10 MG tablet as needed.  No current facility-administered medications for this visit.     Allergies as of 01/05/2018  . (No Known Allergies)    Vitals: BP 133/85 (BP Location: Right Arm, Patient Position: Sitting, Cuff Size: Normal)   Pulse 72   Ht 6' (1.829 m)   Wt 194 lb (88 kg)   BMI 26.31 kg/m  Last Weight:  Wt Readings from Last 1 Encounters:  01/05/18 194 lb (88 kg)   Last Height:   Ht Readings from Last 1 Encounters:  01/05/18 6' (1.829 m)    Physical exam: Exam: Gen: NAD, conversant, well nourised, obese, well groomed                     CV: RRR, no MRG. No Carotid Bruits. No peripheral edema, warm, nontender Eyes: Conjunctivae clear without exudates or hemorrhage  Neuro: Detailed Neurologic Exam  Speech:    Speech is normal; fluent and spontaneous with normal comprehension.  Cognition:    The patient is oriented to person, place, and time;     recent and remote memory intact;     language fluent;     normal attention, concentration,     fund of knowledge Cranial Nerves:    The pupils are equal, round, and reactive to light. The fundi are normal and spontaneous venous pulsations are present. Visual fields are full to finger confrontation. Extraocular movements are intact. Trigeminal sensation is intact and the muscles of mastication are normal. The face is symmetric. The palate elevates in the midline. Hearing intact. Voice is normal. Shoulder shrug is normal. The tongue has normal motion without fasciculations.   Coordination:    Normal finger to nose and heel to shin. Normal rapid alternating movements.     Gait:    Heel-toe and tandem gait are normal.   Motor Observation:    No asymmetry, no atrophy, and no involuntary movements noted. Tone:    Normal muscle tone.    Posture:    Posture is normal. normal erect    Strength: Left deltoid 4/5 Right deltoid 3/5  Left biceps 5/5 Right biceps 3/5  Right hip flexion 4+/5         Otherwise Strength is V/V in the upper and lower limbs.      Sensation: intact to LT     Reflex Exam:  DTR's: absent right bicpes,  diminished right bracho, otherwise     Deep tendon reflexes in the upper and lower extremities are normal bilaterally.   Toes:    The toes are downgoing bilaterally.   Clonus:    Clonus is absent.      Assessment/Plan:  47 y.o. male here as a referral for upper extremity weakness.  He has a past medical history of ankylosing spondylitis and degenerative disc disease.on Humira and steroids.  Rapidely progressive right arm weakness and muscle wasting of the right arm.  Also diminished right arm reflexes.  Could be cervical radiculopathy however he has no neck pain or radicular symptoms. Still this is most likely given scenario, all lower motor neuron signs.  However he appears to have weakness proximally in the left arm as well, could this be a myositis or myopathy? He has been on Humira and steroids which can cause various disorders. I believe his right leg weakness is due to his chronic lumbar radic but will add this to the emg/ncs as well.  Emg/ncs bilateral upper and right lower extremity - will see if we can get him in this week  MRI brain w/wo contrast and MRI cervical spine w/wo contrast to evaluate for demyelinating disease such as MS or secondary to his high-risk medication Humira and also due to chronic neck pain and radiculopathy, to eval for nerve impingement, stroke or other brain or cervical spine lesions for concerning rapidly progressive symptoms.   Will see if we can get workup above completed this  week.   Orders Placed This Encounter  Procedures  . MR BRAIN W WO CONTRAST  . MR CERVICAL SPINE W WO CONTRAST  . NCV with EMG(electromyography)       Naomie Dean, MD  [facility] [address] [place], [place] [address]  Phone [phone] Fax [phone]

## 2018-01-05 NOTE — Telephone Encounter (Signed)
MR Brain w/wo contrast & MR Cervical spine w/wo contrast Dr. Asa LenteAhern [employer] [place] Auth: NPR [place]. Ref # [id number] on 01/05/18 [facility].

## 2018-01-05 NOTE — Patient Instructions (Signed)
Emg/ncs MRI of the brain and cervical spine

## 2018-01-06 ENCOUNTER — Ambulatory Visit: Payer: BLUE CROSS/BLUE SHIELD

## 2018-01-06 DIAGNOSIS — Z79899 Other long term (current) drug therapy: Secondary | ICD-10-CM | POA: Diagnosis not present

## 2018-01-06 DIAGNOSIS — R29818 Other symptoms and signs involving the nervous system: Secondary | ICD-10-CM

## 2018-01-06 DIAGNOSIS — M450 Ankylosing spondylitis of multiple sites in spine: Secondary | ICD-10-CM | POA: Diagnosis not present

## 2018-01-06 DIAGNOSIS — M5412 Radiculopathy, cervical region: Secondary | ICD-10-CM

## 2018-01-06 DIAGNOSIS — M62521 Muscle wasting and atrophy, not elsewhere classified, right upper arm: Secondary | ICD-10-CM

## 2018-01-06 DIAGNOSIS — R531 Weakness: Secondary | ICD-10-CM

## 2018-01-06 DIAGNOSIS — D8989 Other specified disorders involving the immune mechanism, not elsewhere classified: Secondary | ICD-10-CM | POA: Diagnosis not present

## 2018-01-06 MED ORDER — GADOPENTETATE DIMEGLUMINE 469.01 MG/ML IV SOLN
18.0000 mL | Freq: Once | INTRAVENOUS | Status: AC | PRN
  Administered 2018-01-06: 18 mL via INTRAVENOUS

## 2018-01-07 ENCOUNTER — Telehealth: Payer: Self-pay | Admitting: *Deleted

## 2018-01-07 NOTE — Telephone Encounter (Addendum)
Called pt & LVM (ok per DPR) to discuss MRI results. Asked for call back. Left office number and hours in message.   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 I still think this is coming from his neck. I reviewed this with Dr. Anne HahnWillis who is going to do his emg/ncs next week. If nothing else is found on emg/ncs I suggest a neurosurgery evaluation for surgery. It usually takes a few weeks to get to see the neurosurgeon so I prefer to order the consult (for cervical stenosis and cervical radic) now. Please place referral if he agrees, thanks

## 2018-01-08 ENCOUNTER — Telehealth: Payer: Self-pay | Admitting: Neurology

## 2018-01-08 NOTE — Telephone Encounter (Signed)
-----   Message from Anson FretAntonia B Ahern, MD sent at 01/08/2018 10:20 AM EDT ----- Normal MRI brain

## 2018-01-08 NOTE — Telephone Encounter (Signed)
Called the Jon Henderson and made them aware that the MRI of the brain was normal and Dr Lucia GaskinsAhern had no concerns. Jon Henderson verbalized understanding. Jon Henderson had no questions at this time but was encouraged to call back if questions arise.

## 2018-01-13 ENCOUNTER — Encounter: Payer: Self-pay | Admitting: Neurology

## 2018-01-13 ENCOUNTER — Encounter: Payer: Self-pay | Admitting: Rheumatology

## 2018-01-13 ENCOUNTER — Ambulatory Visit (INDEPENDENT_AMBULATORY_CARE_PROVIDER_SITE_OTHER): Payer: BLUE CROSS/BLUE SHIELD | Admitting: Neurology

## 2018-01-13 ENCOUNTER — Encounter

## 2018-01-13 DIAGNOSIS — M450 Ankylosing spondylitis of multiple sites in spine: Secondary | ICD-10-CM

## 2018-01-13 DIAGNOSIS — M62521 Muscle wasting and atrophy, not elsewhere classified, right upper arm: Secondary | ICD-10-CM

## 2018-01-13 DIAGNOSIS — R531 Weakness: Secondary | ICD-10-CM

## 2018-01-13 DIAGNOSIS — M5412 Radiculopathy, cervical region: Secondary | ICD-10-CM

## 2018-01-13 DIAGNOSIS — D8989 Other specified disorders involving the immune mechanism, not elsewhere classified: Secondary | ICD-10-CM

## 2018-01-13 DIAGNOSIS — Z79899 Other long term (current) drug therapy: Secondary | ICD-10-CM

## 2018-01-13 NOTE — Progress Notes (Signed)
[facility]    Nerve / Sites Muscle Latency Ref. Amplitude Ref. Rel Amp Segments Distance Velocity Ref. Area    ms ms mV mV %  cm m/s m/s mVms  R Median - APB     Wrist APB 3.6 ?4.4 5.1 ?4.0 100 Wrist - APB 7   19.8     Upper arm APB 8.3  4.9  96.1 Upper arm - Wrist 24 51 ?49 18.5  L Median - APB     Wrist APB 3.5 ?4.4 6.5 ?4.0 100 Wrist - APB 7   27.3     Upper arm APB 8.3  6.3  96.1 Upper arm - Wrist 24 51 ?49 25.8  R Ulnar - ADM     Wrist ADM 2.8 ?3.3 9.7 ?6.0 100 Wrist - ADM 7   32.4     B.Elbow ADM 7.0  9.0  92.6 B.Elbow - Wrist 22 52 ?49 32.2     A.Elbow ADM 9.0  8.7  97 A.Elbow - B.Elbow 10 51 ?49 31.3         A.Elbow - Wrist      L Ulnar - ADM     Wrist ADM 2.5 ?3.3 8.8 ?6.0 100 Wrist - ADM 7   28.9     B.Elbow ADM 6.7  8.4  95.7 B.Elbow - Wrist 22 53 ?49 29.2     A.Elbow ADM 8.4  8.3  98.8 A.Elbow - B.Elbow 10 56 ?49 29.4         A.Elbow - Wrist      R Peroneal - EDB     Ankle EDB 4.2 ?6.5 6.9 ?2.0 100 Ankle - EDB 9   26.7     Fib head EDB 11.6  6.7  95.8 Fib head - Ankle 34 46 ?44 26.4     Pop fossa EDB 13.7  6.4  95.5 Pop fossa - Fib head 10 47 ?44 25.1         Pop fossa - Ankle      R Tibial - AH     Ankle AH 3.8 ?5.8 14.4 ?4.0 100 Ankle - AH 9   33.5     Pop fossa AH 13.4  10.0  69.2 Pop fossa - Ankle 40 42 ?41 28.0  R Musculocutaneous - Biceps     Supraclav fossa Biceps 4.5  4.4  100     29.1  L Musculocutaneous - Biceps     Supraclav fossa Biceps 4.5  7.7  100     79.6                     SNC    Nerve / Sites Rec. Site Peak Lat Ref.  Amp Ref. Segments Distance Peak Diff Ref.    ms ms V V  cm ms ms  R Radial - Anatomical snuff box (Forearm)     Forearm Wrist 2.6 ?2.9 16 ?15 Forearm - Wrist 10    L Radial - Anatomical snuff box (Forearm)     Forearm Wrist 2.3 ?2.9 12 ?15 Forearm - Wrist 10    R Sural - Ankle (Calf)     Calf Ankle 3.4 ?4.4 16 ?6 Calf - Ankle 14    R Superficial peroneal - Ankle     Lat leg Ankle 3.5 ?4.4 9 ?6 Lat leg - Ankle 14    R Median, Ulnar -  Transcarpal comparison     Median Palm Wrist 2.6 ?2.2 28 ?35 Median Palm -  Wrist 8       Ulnar Palm Wrist 2.2 ?2.2 14 ?12 Ulnar Palm - Wrist 8          Median Palm - Ulnar Palm  0.3 ?0.4  R Median - Orthodromic (Dig II, Mid palm)     Dig II Wrist 3.6 ?3.4 12 ?10 Dig II - Wrist 13    L Median - Orthodromic (Dig II, Mid palm)     Dig II Wrist 3.2 ?3.4 18 ?10 Dig II - Wrist 13    R Ulnar - Orthodromic, (Dig V, Mid palm)     Dig V Wrist 2.7 ?3.1 8 ?5 Dig V - Wrist 11    L Ulnar - Orthodromic, (Dig V, Mid palm)     Dig V Wrist 2.7 ?3.1 12 ?5 Dig V - Wrist 1611                         F  Wave    Nerve F Lat Ref.   ms ms  R Tibial - AH 52.7 ?56.0  R Ulnar - ADM 29.0 ?32.0  L Ulnar - ADM 29.9 ?32.0

## 2018-01-13 NOTE — Procedures (Signed)
     HISTORY:  Jon Henderson is a 47 year old gentleman with onset of relatively painless progressive weakness involving the right upper extremity.  The patient may have occasional healing sensations in the fourth and fifth fingers of the right hand, but otherwise very little sensory alteration has been noted.  The patient denies any significant neck pain.  He is being evaluated for the progressive weakness.  NERVE CONDUCTION STUDIES:  Nerve conduction studies were performed on both upper extremities. The distal motor latencies and motor amplitudes for the median and ulnar nerves were within normal limits.  The distal motor latencies for the musculocutaneous nerves were symmetric, somewhat lower amplitude on the right than the left.  The nerve conduction velocities for the median and ulnar nerves were also normal. The sensory latencies for the median, radial, and ulnar nerves were normal.  The F-wave latencies for the ulnar nerves were normal bilaterally.  Nerve conduction studies were performed on right lower extremity. The distal motor latencies and motor amplitudes for the peroneal and posterior tibial nerves were within normal limits. The nerve conduction velocities for these nerves were also normal. The sensory latencies for the peroneal and sural nerves were within normal limits.  The F-wave latency for the posterior tibial nerve was normal.   EMG STUDIES:  EMG study was performed on the right upper extremity:  The first dorsal interosseous muscle reveals 2 to 4 K units with full recruitment. No fibrillations or positive waves were noted. The abductor pollicis brevis muscle reveals 2 to 4 K units with full recruitment. No fibrillations or positive waves were noted. The extensor indicis proprius muscle reveals 1 to 3 K units with full recruitment. No fibrillations or positive waves were noted. The pronator teres muscle reveals 2 to 3 K units with decreased recruitment. No fibrillations or  positive waves were noted. The biceps muscle reveals 1 to 2 K units with decreased recruitment. 1+ positive waves were noted.  Polyphasic units were seen. The triceps muscle reveals 2 to 4 K units with full recruitment. No fibrillations or positive waves were noted. The anterior deltoid muscle reveals 2 to 3 K units with slightly reduced recruitment. No fibrillations or positive waves were noted. The supraspinatus muscle revealed 2 to 3 K units with a very slight reduction in recruitment seen.  No fibrillations or positive waves were noted. The cervical paraspinal muscles were tested at 2 levels. No abnormalities of insertional activity were seen at either level tested. There was fair relaxation.   IMPRESSION:  Nerve conduction studies done on both upper extremities and on the right lower extremity were relatively unremarkable.  No clear evidence of a peripheral neuropathy is seen.  EMG evaluation of the right upper extremity shows only minimal acute denervation of the biceps muscle, some reduction in recruitment in the biceps, deltoid, and supraspinatus muscles.  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  Clinical correlation is required.  Marlan Palau. Jon Willis MD 01/13/2018 2:03 PM  [facility] [address] [place], [place] [id number]  Phone [phone] Fax [phone]

## 2018-01-13 NOTE — Progress Notes (Signed)
Please refer to EMG and nerve conduction study procedure note. 

## 2018-01-18 ENCOUNTER — Telehealth: Payer: Self-pay | Admitting: Neurology

## 2018-01-18 NOTE — Telephone Encounter (Signed)
His mri cervical spine and emg/ncs are consistent with cervical radiculopathy. I think he has nerve pinching in his cervical spine. I placed a referral to neurosurgery, has he scheduled with them yet?

## 2018-01-18 NOTE — Telephone Encounter (Signed)
His mri cervical spine and emg/ncs are consistent with cervical radiculopathy but I would like to make sure there isn;t a problem in his brachial plexus. I would like to get one more image if he is ok with that But I do  think he has nerve pinching in his cervical spine which is causing the symptoms. I placed a referral to neurosurgery, has he scheduled with them yet?

## 2018-01-18 NOTE — Telephone Encounter (Signed)
Pt requesting a call to go over his NCV/EMG results, also would like to discuss the f/u procedure needed. Please call to advise

## 2018-01-19 ENCOUNTER — Other Ambulatory Visit: Payer: Self-pay | Admitting: Neurology

## 2018-01-19 DIAGNOSIS — M5412 Radiculopathy, cervical region: Secondary | ICD-10-CM

## 2018-01-19 DIAGNOSIS — M62521 Muscle wasting and atrophy, not elsewhere classified, right upper arm: Secondary | ICD-10-CM

## 2018-01-19 DIAGNOSIS — R29898 Other symptoms and signs involving the musculoskeletal system: Secondary | ICD-10-CM

## 2018-01-19 NOTE — Telephone Encounter (Signed)
I ordered both thanks

## 2018-01-19 NOTE — Telephone Encounter (Signed)
Spoke with patient. Discussed MRI c-spine & EMG result comments from Dr. Lucia GaskinsAhern in detail. He agrees to the additional test to r/o problem in brachial plexus. He also stated that he has not heard from neurosurgery yet. He has had spine surgery in the past and thinks it was done by Dr. Venetia MaxonStern. If possible he would like to have him again but is ok if this is not feasible. He had no further concerns at this time.

## 2018-01-20 ENCOUNTER — Telehealth: Payer: Self-pay | Admitting: Neurology

## 2018-01-20 NOTE — Telephone Encounter (Signed)
[employer] [place] because he is having it outside of [place]. Order sent to GI . They will reach out to the pt to schedule.

## 2018-01-21 ENCOUNTER — Telehealth: Payer: Self-pay | Admitting: Radiology

## 2018-01-21 NOTE — Telephone Encounter (Signed)
Mir[facility]/GI [phone] needs order for with contrast to be changed to w/wo. Thank you, please call to advise

## 2018-01-22 ENCOUNTER — Encounter: Payer: Self-pay | Admitting: Neurology

## 2018-01-25 ENCOUNTER — Other Ambulatory Visit: Payer: Self-pay | Admitting: Neurology

## 2018-01-25 DIAGNOSIS — G540 Brachial plexus disorders: Secondary | ICD-10-CM

## 2018-01-25 DIAGNOSIS — R29898 Other symptoms and signs involving the musculoskeletal system: Secondary | ICD-10-CM

## 2018-01-25 DIAGNOSIS — R531 Weakness: Secondary | ICD-10-CM

## 2018-01-25 NOTE — Telephone Encounter (Signed)
MRI chest w/wo contrast please for brachial plexopathy, right arm weakness and muscle wasting

## 2018-01-25 NOTE — Telephone Encounter (Signed)
Order has been placed.

## 2018-02-04 NOTE — Progress Notes (Signed)
Office Visit Note  Patient: Jon Henderson             Date of Birth: June 07, 1971           MRN: [id number]             PCP: [name], NP Referring: [name], NP Visit Date: 02/18/2018 Occupation: @GUAROCC @  Subjective:  Neck and thoracic pain.   History of Present Illness: Jon Henderson is a 47 y.o. male with history of ankylosing spondylitis.  He was seen by Dr. Lucia Gaskins and had MRI of his brain and cervical spine.  He does have cervical degenerative disc disease.  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  He has been experiencing numbness in his left arm.  He continues to have discomfort in his right arm due to disc disease.  He has lost biceps muscle mass.  He continues to have thoracic pain due to underlying disc disease as he works as a Investment banker, operational.  He complains of some discomfort in his knee joints and his feet.  Although he has not seen any swelling.  Activities of Daily Living:  Patient reports morning stiffness for 24 hours.   Patient Reports nocturnal pain.  Difficulty dressing/grooming: Denies Difficulty climbing stairs: Denies Difficulty getting out of chair: Denies Difficulty using hands for taps, buttons, cutlery, and/or writing: Reports  Review of Systems  Constitutional: Positive for fatigue. Negative for night sweats.  HENT: Negative for mouth sores, mouth dryness and nose dryness.   Eyes: Negative for redness and dryness.  Respiratory: Negative for shortness of breath and difficulty breathing.   Cardiovascular: Negative for chest pain, palpitations, hypertension, irregular heartbeat and swelling in legs/feet.  Gastrointestinal: Negative for abdominal pain, constipation and diarrhea.  Endocrine: Negative for increased urination.  Genitourinary: Negative for pelvic pain.  Musculoskeletal: Positive for arthralgias, joint pain and morning stiffness. Negative for joint swelling,  myalgias, muscle weakness, muscle tenderness and myalgias.  Skin: Negative for color change, rash, hair loss, nodules/bumps, skin tightness, ulcers and sensitivity to sunlight.  Allergic/Immunologic: Negative for susceptible to infections.  Neurological: Positive for numbness and weakness. Negative for dizziness, fainting, light-headedness, headaches, memory loss and night sweats.  Hematological: Negative for bruising/bleeding tendency and swollen glands.  Psychiatric/Behavioral: Negative for depressed mood, confusion and sleep disturbance. The patient is not nervous/anxious.     PMFS History:  Patient Active Problem List   Diagnosis Date Noted  . Ankylosing spondylitis (HCC) 06/12/2016  . Sacroiliitis (HCC) 06/12/2016  . High risk medication use 06/12/2016  . ABNORMAL HEART RHYTHMS 04/20/2008  . DDD (degenerative disc disease), lumbar 04/20/2008  . COUGH 04/20/2008    Past Medical History:  Diagnosis Date  . Ankylosing spondylitis (HCC)   . Disc disorder of lumbar region     Family History  Problem Relation Age of Onset  . Ankylosing spondylitis Mother   . COPD Father   . Drug abuse Sister   . Alcohol abuse Sister   . Stroke Maternal Grandfather   . Healthy Daughter   . ADD / ADHD Daughter   . Healthy Daughter   . ADD / ADHD Daughter   . Healthy Daughter    Past Surgical History:  Procedure Laterality Date  . LUMBAR DISC SURGERY     Social History   Social History Narrative  . Not on file    Objective: Vital Signs: BP 129/77 (BP Location: Left Arm, Patient Position:  Sitting, Cuff Size: Normal)   Pulse 68   Resp 13   Ht 6' (1.829 m)   Wt 193 lb (87.5 kg)   BMI 26.18 kg/m    Physical Exam  Constitutional: He is oriented to person, place, and time. He appears well-developed and well-nourished.  HENT:  Head: Normocephalic and atraumatic.  Eyes: Pupils are equal, round, and reactive to light. Conjunctivae and EOM are normal.  Neck: Normal range of motion. Neck  supple.  Cardiovascular: Normal rate, regular rhythm and normal heart sounds.  Pulmonary/Chest: Effort normal and breath sounds normal.  Abdominal: Soft. Bowel sounds are normal.  Neurological: He is alert and oriented to person, place, and time.  Skin: Skin is warm and dry. Capillary refill takes less than 2 seconds.  Psychiatric: He has a normal mood and affect. His behavior is normal.  Nursing note and vitals reviewed.    Musculoskeletal Exam: C-spine thoracic and lumbar spine good range of motion.  Shoulder joints elbow joints wrist joint MCPs PIPs DIPs were in good range of motion with no synovitis.  Hip joints knee joints ankles MTPs PIPs DIPs were in good range of motion with no synovitis.  There was no evidence of dactylitis, plantar fasciitis or Achilles tendinitis.  He had no SI joint tenderness. CDAI Exam: CDAI Homunculus Exam:   Joint Counts:  CDAI Tender Joint count: 0 CDAI Swollen Joint count: 0  Global Assessments:  Patient Global Assessment: 5 Provider Global Assessment: 2  CDAI Calculated Score: 7   Investigation: No additional findings.  Imaging: Mr Chest W Wo Contrast  Result Date: 02/12/2018 CLINICAL DATA:  Right brachial plexopathy with rapidly progressive right arm weakness and muscle wasting of the right arm. Decreased reflexes in the right arm. EXAM: MRI CHEST WITHOUT AND WITH CONTRAST TECHNIQUE: Multi echo multi planer images were obtained before and after contrast through the region of the right brachial plexus. CONTRAST:  18mL MULTIHANCE GADOBENATE DIMEGLUMINE 529 MG/ML IV SOLN COMPARISON:  Thoracic MRI dated 12/29/2017 and cervical MRI dated 01/06/2018 FINDINGS: The right brachial plexus appears normal with no pathologic enhancement after contrast administration. However, there is a 22 x 15 mm mass of the left brachial plexus just posterior to the mid left clavicle, intermediate signal intensity on T1 weighted imaging, inhomogeneously enhancing with a  central area of low signal on postcontrast imaging, and with diffusely increased signal on T2 weighted imaging. Lung apices are normal.  Thyroid gland is normal.  No adenopathy. IMPRESSION: 1. Normal appearing right brachial plexus. 2. 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 Electronically Signed   By: Francene BoyersJames  Maxwell M.D.   On: 02/12/2018 16:52    Recent Labs: TB Gold: 09/15/2017 Negative  Lab Results  Component Value Date   WBC 7.6 09/15/2017   HGB 14.8 09/15/2017   PLT 270 09/15/2017   NA 138 09/15/2017   K 4.4 09/15/2017   CL 101 09/15/2017   CO2 28 09/15/2017   GLUCOSE 93 09/15/2017   BUN 22 09/15/2017   CREATININE 1.09 09/15/2017   BILITOT 0.6 09/15/2017   ALKPHOS 47 03/09/2017   AST 19 09/15/2017   ALT 15 09/15/2017   PROT 7.0 09/15/2017   ALBUMIN 4.4 03/09/2017   CALCIUM 9.6 09/15/2017   GFRAA 94 09/15/2017  11/2017-CBC and CMP were normal.  Speciality Comments: No specialty comments available.  Procedures:  No procedures performed Allergies: Patient has no known allergies.  Assessment / Plan:     Visit Diagnoses: Ankylosing spondylitis of sacral region (HCC)-he had no active synovitis today.  In my opinion his ankylosing spondylitis is well controlled on Humira.  Sacroiliitis (HCC)-he continues to have chronic discomfort in his SI joint but notes and tenderness was elicited.  High risk medication use - Humira 40 mg subcu every other week.  Labs were stable.  We will continue to monitor labs every 3 months.  Atrophy of muscle of right upper arm- due to DDD cervical spine and moderate spinal stenosis.  DDD cervical-he has been having discomfort in the cervical spine.  He also experiences right-sided radiculopathy.  Loss of muscle mass in his right arm.  Ointment coming up with neurosurgery for evaluation and possible surgery.  DDD (degenerative disc disease),  thoracic-chronic pain and discomfort.  DDD (degenerative disc disease), lumbar-pain  Myalgia  Other fatigue  History of gastroesophageal reflux (GERD)   Orders: No orders of the defined types were placed in this encounter.  No orders of the defined types were placed in this encounter.   Face-to-face time spent with patient was 30 minutes. Greater than 50% of time was spent in counseling and coordination of care.  Follow-Up Instructions: Return in about 5 months (around 07/21/2018) for Ankylosing spondylitis, DDD.   Pollyann Savoy, MD  Note - This record has been created using Animal nutritionist.  Chart creation errors have been sought, but may not always  have been located. Such creation errors do not reflect on  the standard of medical care.

## 2018-02-11 ENCOUNTER — Encounter: Payer: BLUE CROSS/BLUE SHIELD | Admitting: Neurology

## 2018-02-12 ENCOUNTER — Ambulatory Visit
Admission: RE | Admit: 2018-02-12 | Discharge: 2018-02-12 | Disposition: A | Discharge status: Home / Self Care | Payer: BLUE CROSS/BLUE SHIELD | Source: Ambulatory Visit | Attending: Neurology | Admitting: Neurology

## 2018-02-12 ENCOUNTER — Other Ambulatory Visit: Payer: BLUE CROSS/BLUE SHIELD

## 2018-02-12 DIAGNOSIS — R531 Weakness: Secondary | ICD-10-CM

## 2018-02-12 DIAGNOSIS — G540 Brachial plexus disorders: Secondary | ICD-10-CM

## 2018-02-12 DIAGNOSIS — R29898 Other symptoms and signs involving the musculoskeletal system: Secondary | ICD-10-CM

## 2018-02-12 MED ORDER — GADOBENATE DIMEGLUMINE 529 MG/ML IV SOLN
18.0000 mL | Freq: Once | INTRAVENOUS | Status: AC | PRN
  Administered 2018-02-12: 18 mL via INTRAVENOUS

## 2018-02-15 ENCOUNTER — Telehealth: Payer: Self-pay | Admitting: Neurology

## 2018-02-15 NOTE — Telephone Encounter (Signed)
Discussed with patient, neurogenic tumor left brachial plexus, will discuss with neurosurgery at visit and may need further imaging

## 2018-02-15 NOTE — Telephone Encounter (Signed)
Pt retuning a call regarding MRI results.

## 2018-02-16 ENCOUNTER — Encounter: Payer: Self-pay | Admitting: Neurology

## 2018-02-18 ENCOUNTER — Ambulatory Visit (INDEPENDENT_AMBULATORY_CARE_PROVIDER_SITE_OTHER): Payer: BLUE CROSS/BLUE SHIELD | Admitting: Rheumatology

## 2018-02-18 ENCOUNTER — Encounter: Payer: Self-pay | Admitting: Physician Assistant

## 2018-02-18 VITALS — BP 129/77 | HR 68 | Resp 13 | Ht 72.0 in | Wt 193.0 lb

## 2018-02-18 DIAGNOSIS — Z79899 Other long term (current) drug therapy: Secondary | ICD-10-CM | POA: Diagnosis not present

## 2018-02-18 DIAGNOSIS — M5136 Other intervertebral disc degeneration, lumbar region: Secondary | ICD-10-CM

## 2018-02-18 DIAGNOSIS — Z8719 Personal history of other diseases of the digestive system: Secondary | ICD-10-CM

## 2018-02-18 DIAGNOSIS — M461 Sacroiliitis, not elsewhere classified: Secondary | ICD-10-CM | POA: Diagnosis not present

## 2018-02-18 DIAGNOSIS — M458 Ankylosing spondylitis sacral and sacrococcygeal region: Secondary | ICD-10-CM

## 2018-02-18 DIAGNOSIS — M47892 Other spondylosis, cervical region: Secondary | ICD-10-CM

## 2018-02-18 DIAGNOSIS — M791 Myalgia, unspecified site: Secondary | ICD-10-CM

## 2018-02-18 DIAGNOSIS — M5134 Other intervertebral disc degeneration, thoracic region: Secondary | ICD-10-CM

## 2018-02-18 DIAGNOSIS — M51369 Other intervertebral disc degeneration, lumbar region without mention of lumbar back pain or lower extremity pain: Secondary | ICD-10-CM

## 2018-02-18 DIAGNOSIS — M62521 Muscle wasting and atrophy, not elsewhere classified, right upper arm: Secondary | ICD-10-CM | POA: Diagnosis not present

## 2018-02-18 DIAGNOSIS — R5383 Other fatigue: Secondary | ICD-10-CM

## 2018-02-18 NOTE — Patient Instructions (Signed)
Standing Labs We placed an order today for your standing lab work.    Please come back and get your standing labs in August and every 3 months   We have open lab Monday through Friday from 8:30-11:30 AM and 1:30-4:00 PM  at the office of Dr. Jailine Lieder.   You may experience shorter wait times on Monday and Friday afternoons. The office is located at [address], [address], [place], [place] [address] No appointment is necessary.   Labs are drawn by [facility].  You may receive a bill from [facility] for your lab work. If you have any questions regarding directions or hours of operation,  please call [phone].    

## 2018-02-26 ENCOUNTER — Other Ambulatory Visit: Payer: Self-pay | Admitting: Rheumatology

## 2018-03-01 NOTE — Telephone Encounter (Signed)
Last visit: 02/18/2018 Next visit: 08/05/2018 Labs: 12/16/2017 Tb gold: 09/15/2017 negative   Okay to refill per Dr. Corliss Skainseveshwar.

## 2018-03-03 ENCOUNTER — Encounter: Payer: Self-pay | Admitting: Neurology

## 2018-03-04 ENCOUNTER — Encounter: Payer: Self-pay | Admitting: Rheumatology

## 2018-03-04 ENCOUNTER — Other Ambulatory Visit: Payer: Self-pay | Admitting: Neurology

## 2018-03-04 DIAGNOSIS — G540 Brachial plexus disorders: Secondary | ICD-10-CM

## 2018-03-04 NOTE — Telephone Encounter (Signed)
Spoke to patient he needs [facility], patient will forward a name to me thanks

## 2018-03-04 NOTE — Telephone Encounter (Signed)
Pt has called stating that he went to the Neurosurgeon and is now being referred back to Dr Lucia GaskinsAhern.  Pt is asking for a call back from Dr Lucia GaskinsAhern to discuss further plan of action.  Pt states that he did reach out to Dr Lucia GaskinsAhern by [facility] but did not get a response.  Pt states 2 other doctors are waiting on what the outcome of this will be as to why he is calling now asking for a call back from Dr Lucia GaskinsAhern.

## 2018-03-04 NOTE — Telephone Encounter (Signed)
Dr. Lucia GaskinsAhern speaking with pt now.

## 2018-03-05 ENCOUNTER — Telehealth: Payer: Self-pay | Admitting: Pharmacy Technician

## 2018-03-05 NOTE — Telephone Encounter (Signed)
Please apply for taltz, per Dr. Fatima Sangereveshwar's note. Thanks!

## 2018-03-05 NOTE — Telephone Encounter (Signed)
I had detailed discussion with the patient.  He states he had an appointment with Dr. Venetia MaxonStern.  He states Dr. Venetia MaxonStern did not think that his muscle weakness is related to his cervical spine.  He felt that he might be developing demyelinating disease.  He had an appointment with Dr. Lucia GaskinsAhern yesterday.  He states Dr. Lucia GaskinsAhern is referred him to a neuromuscular specialist.  He will discontinue Humira for right now.  He failed Cosentyx.  We discussed possible use of Taltz.  He wants us to apply for Taltz.  He currently have an infection on his face for which she is taking doxycycline.  I have advised him to contact us once the infection clears up so we can start him on Taltz.

## 2018-03-05 NOTE — Telephone Encounter (Signed)
Received a Prior Authorization request from Laural BenesMarissa G for [employer]. Authorization has been submitted to patient's insurance via Cover My Meds. Will update once we receive a response.  4:26 PM Dorthula Nettlesachael N Sejla Marzano, CPhT

## 2018-03-08 ENCOUNTER — Telehealth: Payer: Self-pay | Admitting: Neurology

## 2018-03-08 ENCOUNTER — Encounter: Payer: Self-pay | Admitting: Rheumatology

## 2018-03-08 MED ORDER — PREDNISONE 5 MG PO TABS: ORAL_TABLET | ORAL | 0 refills | Status: DC

## 2018-03-08 MED ORDER — TRAMADOL HCL 50 MG PO TABS: 100.0000 mg | ORAL_TABLET | Freq: Three times a day (TID) | ORAL | 0 refills | Status: DC

## 2018-03-08 NOTE — Telephone Encounter (Signed)
These call in prednisone starting at 20 mg p.o. daily and taper by 5 mg every 4 days.  Please call tramadol 50 mg 2 tablets p.o. 3 times daily 30-day supply.

## 2018-03-08 NOTE — Telephone Encounter (Signed)
[facility] will need his MRI disc .

## 2018-03-16 NOTE — Telephone Encounter (Signed)
Received a fax from [employer] regarding a prior authorization for Taltz 80mg . Authorization has been APPROVED for 3 pens/28 days from 03/16/2018 to 04/13/2018. Approved for 2 pens/ 28 days from 05/11/2018 to 07/05/2018. And then approved for 1 pen/ 28 days from 08/02/2018 to 10/25/2018.   Please send in rx. Patient must use [facility].  Phone: [phone] Fax: [phone]  Will send document to scan center.  Authorization # [id number], [id number],and 802 Phone #[phone]  4:30 PM Dorthula Nettlesachael N Kynlee Koenigsberg, CPhT

## 2018-03-17 ENCOUNTER — Telehealth: Payer: Self-pay | Admitting: Pharmacist

## 2018-03-17 ENCOUNTER — Other Ambulatory Visit: Payer: Self-pay | Admitting: Pharmacist

## 2018-03-17 DIAGNOSIS — M458 Ankylosing spondylitis sacral and sacrococcygeal region: Secondary | ICD-10-CM

## 2018-03-17 MED ORDER — IXEKIZUMAB 80 MG/ML ~~LOC~~ SOAJ: 80.0000 mg | SUBCUTANEOUS | 0 refills | Status: DC

## 2018-03-17 NOTE — Telephone Encounter (Signed)
Called patient to inform him the appeal for Altamease Oileraltz had been approved but required [facility] to [facility].  Instructed patient to call us if he needs us to look into patient assistance.    Patient is still on antibiotics for skin infection.  He will follow up with dermatologist about length of therapy and will call our office to schedule first dose visit when infection is cleared.

## 2018-03-17 NOTE — Progress Notes (Signed)
Sent Rx for Taltz for induction period of 12 weeks. Will receive starting dose in office. Next script should be for maintenance period of 80mg  every 4 weeks.

## 2018-04-19 ENCOUNTER — Encounter: Payer: Self-pay | Admitting: Rheumatology

## 2018-04-21 ENCOUNTER — Telehealth: Payer: Self-pay | Admitting: Neurology

## 2018-04-21 NOTE — Telephone Encounter (Signed)
Mailed Patient MRI CD's to take with him to his apt. Patient is aware I spoke to Him.

## 2018-04-26 NOTE — Progress Notes (Signed)
Pharmacy Note  Subjective:  Patient presents today to the [facility] to see Dr. Corliss Skains.  Patient was seen by the pharmacist for counseling on Taltz for Ankylosing Spondolytis.  Objective:  CBC    Component Value Date/Time   WBC 7.6 09/15/2017 1511   RBC 4.77 09/15/2017 1511   HGB 14.8 09/15/2017 1511   HCT 42.9 09/15/2017 1511   PLT 270 09/15/2017 1511   MCV 89.9 09/15/2017 1511   MCH 31.0 09/15/2017 1511   MCHC 34.5 09/15/2017 1511   RDW 12.3 09/15/2017 1511   LYMPHSABS 2,242 09/15/2017 1511   MONOABS 848 03/09/2017 1549   EOSABS 30 09/15/2017 1511   BASOSABS 38 09/15/2017 1511    CMP     Component Value Date/Time   NA 138 09/15/2017 1511   K 4.4 09/15/2017 1511   CL 101 09/15/2017 1511   CO2 28 09/15/2017 1511   GLUCOSE 93 09/15/2017 1511   BUN 22 09/15/2017 1511   CREATININE 1.09 09/15/2017 1511   CALCIUM 9.6 09/15/2017 1511   PROT 7.0 09/15/2017 1511   ALBUMIN 4.4 03/09/2017 1549   AST 19 09/15/2017 1511   ALT 15 09/15/2017 1511   ALKPHOS 47 03/09/2017 1549   BILITOT 0.6 09/15/2017 1511   GFRNONAA 81 09/15/2017 1511   GFRAA 94 09/15/2017 1511    TB Gold: negative 09/15/17 Hepatitis panel: negative 02/19/2009 HIV: negative 01/07/17 SPEP: within normal limits 01/15/12 Immunoglobulin: within normal limits 01/07/17  Does patient have a history of inflammatory bowel disease? No   Assessment/Plan:  Counseled patient that Altamease Oiler is a IL-17 inhibitor that works to reduce pain and inflammation associated with arthritis.  Counseled patient on purpose, proper use, and adverse effects of Taltz. Reviewed the most common adverse effects of infection, inflammatory bowel disease, and allergic reaction.  Reviewed the importance of regular labs while on Taltz.  Counseled patient that Altamease Oiler should be held prior to scheduled surgery.  Counseled patient to avoid live vaccines while on Taltz.  Advised patient to get annual influenza vaccine and the pneumococcal  vaccine as indicated.  Provided patient with medication education material and answered all questions.  Patient consented to Taltz.  Reviewed storage information for Taltz.  Advised initial injection must be administered in office.  Patient voiced understanding.    Demonstrated proper injection technique with [employer].  Patient able to demonstrate proper injection technique using the teach back method. Patient self injected in the left and right upper thight Taltz with:  Sample Medication: Taltz (2 80mg  injections) NDC: 1610-9604-54 Lot: [id number] Expiration: 03/2019  Patient tolerated well.  Observed for 30 mins in office for adverse reaction and  None noted.  Instructed patient to call with any questions/issues.    Patient is to call pharmacy to schedule first shipment of medication.  All questions encouraged and answered.  He also put a reminder in his phone to take every 2 weeks for 12 weeks.  Verlin Fester, PharmD, [facility] Rheumatology Clinical Pharmacist  04/27/2018 12:09 PM

## 2018-04-27 ENCOUNTER — Ambulatory Visit (INDEPENDENT_AMBULATORY_CARE_PROVIDER_SITE_OTHER): Payer: BLUE CROSS/BLUE SHIELD | Admitting: Pharmacist

## 2018-04-27 VITALS — BP 133/82

## 2018-04-27 DIAGNOSIS — M458 Ankylosing spondylitis sacral and sacrococcygeal region: Secondary | ICD-10-CM | POA: Diagnosis not present

## 2018-04-27 MED ORDER — IXEKIZUMAB 80 MG/ML ~~LOC~~ SOAJ
80.0000 mg | Freq: Once | SUBCUTANEOUS | Status: AC
  Administered 2018-04-27: 80 mg via SUBCUTANEOUS

## 2018-04-27 NOTE — Patient Instructions (Addendum)
Taltz dosing: every 2 weeks for 12 weeks, then every 4 weeks  Standing Labs We placed an order today for your standing lab work.    Please come back and get your standing labs in 1 month and then every 3 months.  We have open lab Monday through Friday from 8:30-11:30 AM and 1:30-4:00 PM  at the office of Dr. Pollyann Savoy.   You may experience shorter wait times on Monday and Friday afternoons. The office is located at [address], [address], [place], [place] [address] No appointment is necessary.   Labs are drawn by [facility].  You may receive a bill from [facility] for your lab work. If you have any questions regarding directions or hours of operation,  please call [phone].   Just as a reminder please drink plenty of water prior to coming for your lab work. Thanks!  Vaccines You are taking a medication(s) that can suppress your immune system.  The following immunizations are recommended: . Flu annually . Pneumonia . Shingrix . Hepatitis B  Please check with your PCP to make sure you are up to date.

## 2018-05-31 ENCOUNTER — Other Ambulatory Visit: Payer: Self-pay | Admitting: Rheumatology

## 2018-05-31 NOTE — Telephone Encounter (Signed)
Last visit: 02/18/2018 Next visit: 08/05/2018 Labs: 04/19/18 neg   Okay to refill per Dr. Corliss Skains

## 2018-07-06 ENCOUNTER — Encounter: Payer: Self-pay | Admitting: Rheumatology

## 2018-07-06 DIAGNOSIS — Z79899 Other long term (current) drug therapy: Secondary | ICD-10-CM

## 2018-07-07 NOTE — Telephone Encounter (Signed)
Can you call [facility] and see what the issue is?  We have not received any information about an exemption.  Thank you!

## 2018-07-07 NOTE — Telephone Encounter (Signed)
Insurance approved a very detailed authorization for the patient's dosing taper. Since the patient didn't start at the beginning of the authorization, the schedule is now off. Providing 3 samples should solve the issue. The next authorization will start on 08/02/18.

## 2018-07-13 NOTE — Telephone Encounter (Signed)
Informed patient that his prior authorization was approved for specific dates and are off since he had to delay initiation due to infection.  He is approved for Taltz 80 mg every 28 days as of 08/02/2018.  The plan was to give him samples to get him through till the 1/6 but he was only given 1 sample.  Patient verbalized understanding and plans to come by the office for sample today.  He also is off on his dosing schedule due to holding for a few days due to infection.  When he comes for sample will speak with patient to ascertain if this week was last of loading dose.  Patient also complaining of sever localized reaction.  States it is painful and then becomes very red, swollen, and irritated.  Also states previous injections of other medications felt like a 3-4 on pain scale and that Altamease Oileraltz is an 8.  He wants to try icing prior to injection to see if it helps.  Encouraged patient to ice prior to injection and that we could discuss other strategies to help minimize reaction when he stops by for samples.  After trying different management strategies if it is still an issue we can discuss alternative therapy.  All questions encouraged and answered.  Instructed patient to call with any further questions or concerns.  Verlin FesterAmber Arlena Marsan, PharmD, [facility] Rheumatology Clinical Pharmacist  07/13/2018 1:09 PM

## 2018-07-13 NOTE — Telephone Encounter (Signed)
Patient is also due for labs.  Determined issue is not prior authorization as patient stated but that he is out of refills due to not updating labs.  May still pick up sample must obtain labs and be seen or schedule office for any further samples or refills per Dr. Corliss Skainseveshwar.

## 2018-07-14 ENCOUNTER — Ambulatory Visit (INDEPENDENT_AMBULATORY_CARE_PROVIDER_SITE_OTHER): Payer: BLUE CROSS/BLUE SHIELD | Admitting: Rheumatology

## 2018-07-14 ENCOUNTER — Encounter: Payer: Self-pay | Admitting: Rheumatology

## 2018-07-14 VITALS — BP 140/91 | HR 79 | Resp 14 | Ht 72.0 in | Wt 193.0 lb

## 2018-07-14 DIAGNOSIS — Z79899 Other long term (current) drug therapy: Secondary | ICD-10-CM | POA: Diagnosis not present

## 2018-07-14 DIAGNOSIS — M461 Sacroiliitis, not elsewhere classified: Secondary | ICD-10-CM | POA: Diagnosis not present

## 2018-07-14 DIAGNOSIS — M450 Ankylosing spondylitis of multiple sites in spine: Secondary | ICD-10-CM

## 2018-07-14 DIAGNOSIS — M5136 Other intervertebral disc degeneration, lumbar region: Secondary | ICD-10-CM | POA: Diagnosis not present

## 2018-07-14 DIAGNOSIS — G629 Polyneuropathy, unspecified: Secondary | ICD-10-CM

## 2018-07-14 DIAGNOSIS — M5030 Other cervical disc degeneration, unspecified cervical region: Secondary | ICD-10-CM

## 2018-07-14 DIAGNOSIS — T8090XA Unspecified complication following infusion and therapeutic injection, initial encounter: Secondary | ICD-10-CM

## 2018-07-14 NOTE — Patient Instructions (Signed)
Standing Labs We placed an order today for your standing lab work.    Please come back and get your standing labs in December and every 3 months  We have open lab Monday through Friday from 8:30-11:30 AM and 1:30-4:00 PM  at the office of Dr. Pollyann SavoyShaili Shaheen Star.   You may experience shorter wait times on Monday and Friday afternoons. The office is located at [address], [address], [place], [place] [address] No appointment is necessary.   Labs are drawn by [facility].  You may receive a bill from [facility] for your lab work.  If you wish to have your labs drawn at another location, please call the office 24 hours in advance to send orders.  If you have any questions regarding directions or hours of operation,  please call [phone].   Just as a reminder please drink plenty of water prior to coming for your lab work. Thanks!

## 2018-07-14 NOTE — Progress Notes (Signed)
Office Visit Note  Patient: Jon Henderson             Date of Birth: 1971-02-13           MRN: [id number]             PCP: Everardo Beals, NP Referring: Everardo Beals, NP Visit Date: 07/14/2018 Occupation: '@GUAROCC'$ @  Subjective:  Joint is stiffness and injection site reaction.Marland Kitchen   History of Present Illness: Jon Henderson is a 47 y.o. male closing spondylitis.  He states he has been on loading dose of Toltz now.  He has been having injection site reaction.  He brought some pictures showing the injection site reaction.  He was having a flare prior to starting Donnetta Hail which is getting better.  He states his swelling on his hands has improved.  His lower back pain from the SI joints has been better.  He states he has some discomfort today which he believes is related to underlying disc disease.  He states he has been having progressive weakness in his upper and lower extremity muscles.  He was seeing Dr. Lavell Anchors here locally.  He was referred to [facility] where he has been seeing a neurologist.  He states further work-up is pending.  He will be getting a spinal tap next week.  He is having difficulty lifting objects.  He states at work he has to have other employees do all the lifting for him.  Activities of Daily Living:  Patient reports morning stiffness for 30 minutes.   Patient Denies nocturnal pain.  Difficulty dressing/grooming: Denies Difficulty climbing stairs: Reports Difficulty getting out of chair: Reports Difficulty using hands for taps, buttons, cutlery, and/or writing: Reports  Review of Systems  Constitutional: Positive for fatigue. Negative for night sweats.  HENT: Negative for mouth sores, mouth dryness and nose dryness.   Eyes: Negative for redness and dryness.  Respiratory: Negative for shortness of breath and difficulty breathing.   Cardiovascular: Negative for chest pain, palpitations, hypertension, irregular heartbeat and swelling in legs/feet.    Gastrointestinal: Negative for constipation and diarrhea.  Endocrine: Negative for increased urination.  Musculoskeletal: Positive for arthralgias, joint pain, joint swelling and morning stiffness. Negative for myalgias, muscle weakness, muscle tenderness and myalgias.  Skin: Negative for color change, rash, hair loss, nodules/bumps, skin tightness, ulcers and sensitivity to sunlight.  Allergic/Immunologic: Negative for susceptible to infections.  Neurological: Positive for parasthesias and weakness ( ). Negative for dizziness, fainting, memory loss and night sweats.  Hematological: Negative for swollen glands.  Psychiatric/Behavioral: Negative for depressed mood and sleep disturbance. The patient is not nervous/anxious.     PMFS History:  Patient Active Problem List   Diagnosis Date Noted  . Ankylosing spondylitis ([place]) 06/12/2016  . Sacroiliitis ([place]) 06/12/2016  . High risk medication use 06/12/2016  . ABNORMAL HEART RHYTHMS 04/20/2008  . DDD (degenerative disc disease), lumbar 04/20/2008  . COUGH 04/20/2008    Past Medical History:  Diagnosis Date  . Ankylosing spondylitis ([place])   . Disc disorder of lumbar region     Family History  Problem Relation Age of Onset  . Ankylosing spondylitis Mother   . COPD Father   . Drug abuse Sister   . Alcohol abuse Sister   . Stroke Maternal Grandfather   . Healthy Daughter   . ADD / ADHD Daughter   . Healthy Daughter   . ADD / ADHD Daughter   . Healthy Daughter    Past Surgical History:  Procedure Laterality  Date  . LUMBAR DISC SURGERY     Social History   Social History Narrative  . Not on file    Objective: Vital Signs: BP (!) 140/91 (BP Location: Left Arm, Patient Position: Sitting, Cuff Size: Small)   Pulse 79   Resp 14   Ht 6' (1.829 m)   BMI 26.18 kg/m    Physical Exam Vitals signs and nursing note reviewed.  Constitutional:      Appearance: He is well-developed.  HENT:     Head: Normocephalic and atraumatic.   Eyes:     Conjunctiva/sclera: Conjunctivae normal.     Pupils: Pupils are equal, round, and reactive to light.  Neck:     Musculoskeletal: Normal range of motion and neck supple.  Cardiovascular:     Rate and Rhythm: Normal rate and regular rhythm.     Heart sounds: Normal heart sounds.  Pulmonary:     Effort: Pulmonary effort is normal.     Breath sounds: Normal breath sounds.  Abdominal:     General: Bowel sounds are normal.     Palpations: Abdomen is soft.  Skin:    General: Skin is warm and dry.     Capillary Refill: Capillary refill takes less than 2 seconds.  Neurological:     Mental Status: He is alert and oriented to person, place, and time.     Comments: Loss of muscle mass was noted in both upper extremities and lower extremities.  Psychiatric:        Behavior: Behavior normal.      Musculoskeletal Exam: C-spine thoracic lumbar spine was in good range of motion.  He had mild tenderness over SI joints.  Shoulder joints elbow joints wrist joint MCPs PIPs DIPs with good range of motion with no synovitis.  Hip joints knee joints ankles MTPs PIPs DIPs with good range of motion with no synovitis.  CDAI Exam: CDAI Score: Not documented Patient Global Assessment: Not documented; Provider Global Assessment: Not documented Swollen: Not documented; Tender: Not documented Joint Exam   Not documented   There is currently no information documented on the homunculus. Go to the Rheumatology activity and complete the homunculus joint exam.  Investigation: No additional findings.  Imaging: No results found.  Recent Labs: Lab Results  Component Value Date   WBC 7.6 09/15/2017   HGB 14.8 09/15/2017   PLT 270 09/15/2017   NA 138 09/15/2017   K 4.4 09/15/2017   CL 101 09/15/2017   CO2 28 09/15/2017   GLUCOSE 93 09/15/2017   BUN 22 09/15/2017   CREATININE 1.09 09/15/2017   BILITOT 0.6 09/15/2017   ALKPHOS 47 03/09/2017   AST 19 09/15/2017   ALT 15 09/15/2017   PROT  7.0 09/15/2017   ALBUMIN 4.4 03/09/2017   CALCIUM 9.6 09/15/2017   GFRAA 94 09/15/2017   QFTBGOLDPLUS NEGATIVE 09/15/2017    Speciality Comments: No specialty comments available.  Procedures:  No procedures performed Allergies: Patient has no known allergies.   Assessment / Plan:     Visit Diagnoses: Ankylosing spondylitis of multiple sites in spine (HCC)-he is doing better since he started Taltz injections.  He is a still completing his loading dose.  He has noticed improvement in joint swelling and joint stiffness.  Sacroiliitis (HCC)-he has some SI joint tenderness.  He states the back is been hurting today otherwise it has been feeling better.  High risk medication use -today's labs will be obtained and then every 3 months to monitor for drug toxicity.  Plan: CMP14+EGFR, CBC with Differential/Platelet, CMP14+EGFR, CBC with Differential/Platelet  DDD cervical-he has chronic pain but no radiculopathy.  DDD (degenerative disc disease), lumbar-he has chronic lower back pain.  Injection site reaction, initial encounter-use of Zyrtec and topical hydrocortisone was discussed.  Neuropathy -he has lost a lot of muscle mass over the last few months.  He has been seeing [facility].  He is to have further work-up pending.  Orders: Orders Placed This Encounter  Procedures  . CMP14+EGFR  . CBC with Differential/Platelet   No orders of the defined types were placed in this encounter.   Face-to-face time spent with patient was 30 minutes. Greater than 50% of time was spent in counseling and coordination of care.  Follow-Up Instructions: Return in about 5 months (around 12/13/2018) for Ankylosing spondylitis.   Bo Merino, MD  Note - This record has been created using Editor, commissioning.  Chart creation errors have been sought, but may not always  have been located. Such creation errors do not reflect on  the standard of medical care.

## 2018-07-14 NOTE — Telephone Encounter (Addendum)
Medication Samples have been provided to the patient.  Drug name: Altamease Oileraltz Strength: 80mg  Qty: 1   LOT: [id number]: [id number]   Exp.Date: 06/2019  Dosing instructions: Inject every 28 days  The patient has been instructed regarding the correct time, dose, and frequency of taking this medication, including desired effects and most common side effects.   He was seen in office today by Dr. Corliss Skainseveshwar as walk-in appointment.  Leaira Fullam C Allenmichael Mcpartlin 1:06 PM 07/14/2018

## 2018-07-17 LAB — CMP14+EGFR
ALT: 26 IU/L (ref 0–44)
AST: 23 IU/L (ref 0–40)
Albumin/Globulin Ratio: 1.6 (ref 1.2–2.2)
Albumin: 4.3 g/dL (ref 3.5–5.5)
Alkaline Phosphatase: 64 IU/L (ref 39–117)
BUN/Creatinine Ratio: 19 (ref 9–20)
BUN: 16 mg/dL (ref 6–24)
Bilirubin Total: 0.6 mg/dL (ref 0.0–1.2)
CO2: 25 mmol/L (ref 20–29)
Calcium: 9.3 mg/dL (ref 8.7–10.2)
Chloride: 99 mmol/L (ref 96–106)
Creatinine, Ser: 0.85 mg/dL (ref 0.76–1.27)
GFR calc Af Amer: 120 mL/min/{1.73_m2} (ref >59)
GFR calc non Af Amer: 104 mL/min/{1.73_m2} (ref >59)
Globulin, Total: 2.7 g/dL (ref 1.5–4.5)
Glucose: 102 mg/dL — ABNORMAL HIGH (ref 65–99)
Potassium: 4.8 mmol/L (ref 3.5–5.2)
Sodium: 140 mmol/L (ref 134–144)
Total Protein: 7 g/dL (ref 6.0–8.5)

## 2018-07-17 LAB — CBC WITH DIFFERENTIAL/PLATELET
Basophils Absolute: 0 10*3/uL (ref 0.0–0.2)
Basos: 1 %
EOS (ABSOLUTE): 0 10*3/uL (ref 0.0–0.4)
Eos: 0 %
Hematocrit: 45.4 % (ref 37.5–51.0)
Hemoglobin: 15.7 g/dL (ref 13.0–17.7)
Immature Grans (Abs): 0 10*3/uL (ref 0.0–0.1)
Immature Granulocytes: 0 %
Lymphocytes Absolute: 0.9 10*3/uL (ref 0.7–3.1)
Lymphs: 16 %
MCH: 31 pg (ref 26.6–33.0)
MCHC: 34.6 g/dL (ref 31.5–35.7)
MCV: 90 fL (ref 79–97)
Monocytes Absolute: 0.8 10*3/uL (ref 0.1–0.9)
Monocytes: 13 %
Neutrophils Absolute: 4 10*3/uL (ref 1.4–7.0)
Neutrophils: 70 %
Platelets: 270 10*3/uL (ref 150–450)
RBC: 5.06 x10E6/uL (ref 4.14–5.80)
RDW: 12.4 % (ref 12.3–15.4)
WBC: 5.8 10*3/uL (ref 3.4–10.8)

## 2018-07-17 LAB — SPECIMEN STATUS REPORT

## 2018-07-23 NOTE — Progress Notes (Deleted)
Office Visit Note  Patient: Jon Henderson             Date of Birth: 09-Sep-1970           MRN: [id number]             PCP: Marva PandaMillsaps, Kimberly, NP Referring: Marva PandaMillsaps, Kimberly, NP Visit Date: 08/05/2018 Occupation: @GUAROCC @  Subjective:  No chief complaint on file.  Taltz.  Last TB gold negative on 09/15/2017.  Most recent CBC/CMP within normal limits on 07/16/2018.  Will monitor CBC/CMP every 3 months for drug toxicity and standing orders are in place.  Patient received flu shot in August.  Recommend Prevnar 13 and Pneumovax 23.  History of Present Illness: Jon Henderson is a 47 y.o. male ***   Activities of Daily Living:  Patient reports morning stiffness for *** {minute/hour:19697}.   Patient {ACTIONS;DENIES/REPORTS:21021675::"Denies"} nocturnal pain.  Difficulty dressing/grooming: {ACTIONS;DENIES/REPORTS:21021675::"Denies"} Difficulty climbing stairs: {ACTIONS;DENIES/REPORTS:21021675::"Denies"} Difficulty getting out of chair: {ACTIONS;DENIES/REPORTS:21021675::"Denies"} Difficulty using hands for taps, buttons, cutlery, and/or writing: {ACTIONS;DENIES/REPORTS:21021675::"Denies"}  No Rheumatology ROS completed.   PMFS History:  Patient Active Problem List   Diagnosis Date Noted  . Ankylosing spondylitis (HCC) 06/12/2016  . Sacroiliitis (HCC) 06/12/2016  . High risk medication use 06/12/2016  . ABNORMAL HEART RHYTHMS 04/20/2008  . DDD (degenerative disc disease), lumbar 04/20/2008  . COUGH 04/20/2008    Past Medical History:  Diagnosis Date  . Ankylosing spondylitis (HCC)   . Disc disorder of lumbar region     Family History  Problem Relation Age of Onset  . Ankylosing spondylitis Mother   . COPD Father   . Drug abuse Sister   . Alcohol abuse Sister   . Stroke Maternal Grandfather   . Healthy Daughter   . ADD / ADHD Daughter   . Healthy Daughter   . ADD / ADHD Daughter   . Healthy Daughter    Past Surgical History:  Procedure Laterality Date  . LUMBAR  DISC SURGERY     Social History   Social History Narrative  . Not on file   Immunization History  Administered Date(s) Administered  . Influenza-Unspecified 03/23/2018    Objective: Vital Signs: There were no vitals taken for this visit.   Physical Exam   Musculoskeletal Exam: ***  CDAI Exam: CDAI Score: Not documented Patient Global Assessment: Not documented; Provider Global Assessment: Not documented Swollen: Not documented; Tender: Not documented Joint Exam   Not documented   There is currently no information documented on the homunculus. Go to the Rheumatology activity and complete the homunculus joint exam.  Investigation: No additional findings.  Imaging: No results found.  Recent Labs: Lab Results  Component Value Date   WBC 5.8 07/16/2018   HGB 15.7 07/16/2018   PLT 270 07/16/2018   NA 140 07/16/2018   K 4.8 07/16/2018   CL 99 07/16/2018   CO2 25 07/16/2018   GLUCOSE 102 (H) 07/16/2018   BUN 16 07/16/2018   CREATININE 0.85 07/16/2018   BILITOT 0.6 07/16/2018   ALKPHOS 64 07/16/2018   AST 23 07/16/2018   ALT 26 07/16/2018   PROT 7.0 07/16/2018   ALBUMIN 4.3 07/16/2018   CALCIUM 9.3 07/16/2018   GFRAA 120 07/16/2018   QFTBGOLDPLUS NEGATIVE 09/15/2017    Speciality Comments: No specialty comments available.  Procedures:  No procedures performed Allergies: Patient has no known allergies.   Assessment / Plan:     Visit Diagnoses: No diagnosis found.   Orders: No orders of the defined types were  placed in this encounter.  No orders of the defined types were placed in this encounter.   Face-to-face time spent with patient was *** minutes. Greater than 50% of time was spent in counseling and coordination of care.  Follow-Up Instructions: No follow-ups on file.   Ellen HenriMarissa C Yousra Ivens, CMA  Note - This record has been created using Animal nutritionistDragon software.  Chart creation errors have been sought, but may not always  have been located. Such  creation errors do not reflect on  the standard of medical care.

## 2018-08-05 ENCOUNTER — Ambulatory Visit: Payer: BLUE CROSS/BLUE SHIELD | Admitting: Rheumatology

## 2018-08-09 IMAGING — MR MR CHEST MEDIASTINUM WO/W CM
11 series · 16 of 16 positions shown · IV contrast (multihance)
Comparison: Thoracic MRI dated 12/29/2017 and cervical MRI dated
01/06/2018

CLINICAL DATA: Right brachial plexopathy with rapidly progressive
right arm weakness and muscle wasting of the right arm. Decreased
reflexes in the right arm.

EXAM:
MRI CHEST WITHOUT AND WITH CONTRAST
TECHNIQUE: Multi echo multi planer images were obtained before and after
contrast through the region of the right brachial plexus.
CONTRAST:  18mL MULTIHANCE GADOBENATE DIMEGLUMINE 529 MG/ML IV SOLN

[Series 5: T1 · axial · 5.0mm · 1.19mm/px · 1 of 37 slices shown (1 of 3)]
[im 1/37]
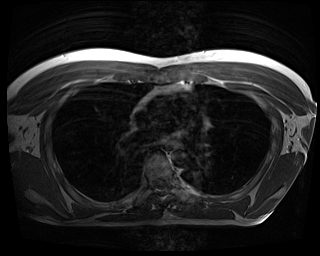

[Series 7: T1 · coronal · 5.0mm · 0.88mm/px · 1 of 29 slices shown (2 of 3)]
[im 1/29]
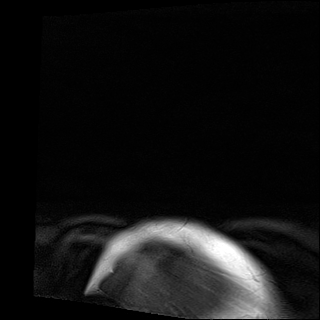

[Series 8: STIR · coronal · 5.0mm · 1.09mm/px · 1 of 29 slices shown (1 of 2)]
[im 1/29]
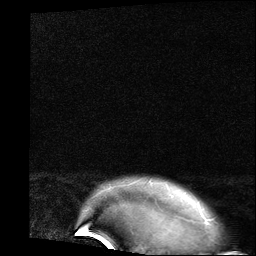

[Series 9: T2 fat-sat · axial · 5.0mm · 1.25mm/px · 1 of 32 slices shown]
[im 1/32]
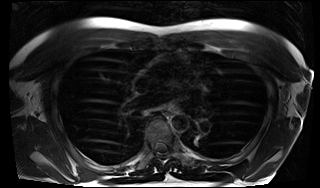

[Series 10: T1 · sagittal · 4.0mm · 1.02mm/px · 1 of 40 slices shown (3 of 3)]
[im 1/40]
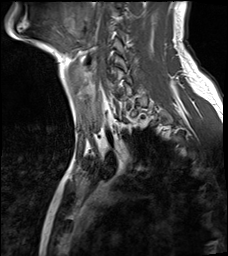

[Series 11: axstir · axial · 5.0mm · 1.56mm/px · z∈[-22,+194]mm · 2 of 37 slices shown]
[im 1/37]
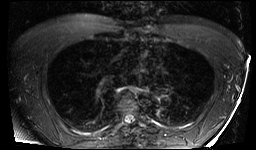
[im 37/37]
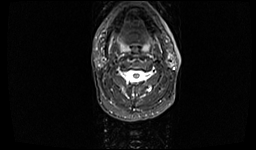

[Series 12: STIR · sagittal · 4.0mm · 0.94mm/px · 2 of 40 slices shown (2 of 2)]
[im 1/40]
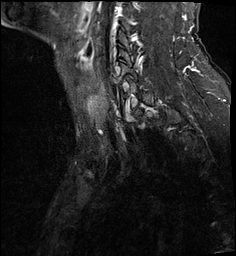
[im 40/40]
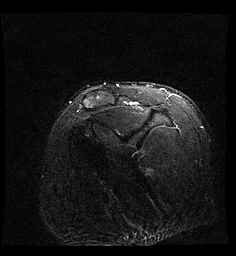

[Series 13: T1 fat-sat · axial · non-contrast · 5.0mm · 1.25mm/px · z∈[-22,+194]mm · 2 of 37 slices shown (1 of 3)]
[im 1/37]
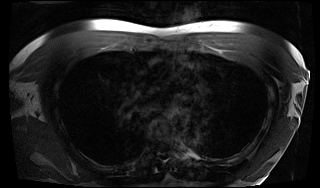
[im 37/37]
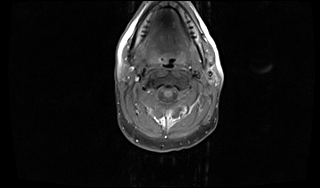

[Series 14: T1 fat-sat post-contrast · axial · 5.0mm · 1.25mm/px · z∈[-22,+194]mm · 2 of 37 slices shown]
[im 1/37]
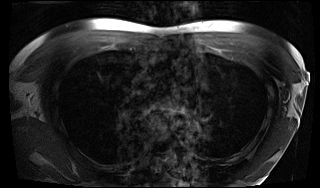
[im 37/37]
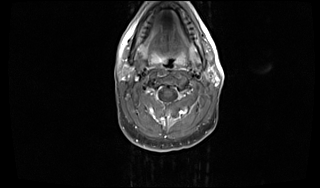

[Series 15: T1 fat-sat · coronal · 5.0mm · 0.98mm/px · 1 of 29 slices shown (2 of 3)]
[im 1/29]
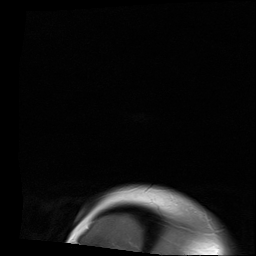

[Series 16: T1 fat-sat · sagittal · 4.0mm · 1.02mm/px · 2 of 40 slices shown (3 of 3)]
[im 1/40]
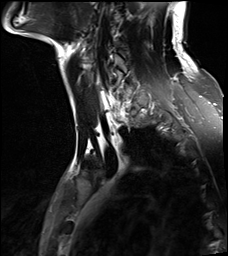
[im 40/40]
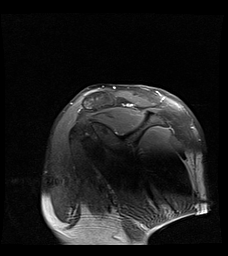

[16 of 16 positions shown; findings below may reference images not displayed]

FINDINGS: The right brachial plexus appears normal with no pathologic
enhancement after contrast administration.

However, there is a 22 x 15 mm mass of the left brachial plexus just
posterior to the mid left clavicle, intermediate signal intensity on
T1 weighted imaging, inhomogeneously enhancing with a central area
of low signal on postcontrast imaging, and with diffusely increased
signal on T2 weighted imaging.

Lung apices are normal.  Thyroid gland is normal.  No adenopathy.
IMPRESSION: 1. Normal appearing right brachial plexus.
2. Mass involving the proximal left brachial plexus at the level of
the mid left clavicle. Imaging characteristics are suggestive of a
neurogenic tumor.

## 2018-08-20 ENCOUNTER — Encounter: Payer: Self-pay | Admitting: Rheumatology

## 2018-08-20 DIAGNOSIS — M458 Ankylosing spondylitis sacral and sacrococcygeal region: Secondary | ICD-10-CM

## 2018-08-24 MED ORDER — IXEKIZUMAB 80 MG/ML ~~LOC~~ SOAJ: 80.0000 mg | SUBCUTANEOUS | 0 refills | Status: DC

## 2018-08-27 NOTE — Telephone Encounter (Signed)
He is still having trouble obtaining medication from pharmacy.  Verbal order given last week with updated dosing instructions.  When he called to fill his prescription he was told that it was stuck in the benefits department.  Every time our office calls the insurance company they state no prior authorization is needed and they have all the information that they need.  We will follow-up again and update patient only receive a response.  Medication Samples have been provided to the patient.  Drug name: Altamease Oiler Strength: 80mg         Qty: 1 LOT: [id number]   Exp.Date: 06/28/2019  Dosing instructions:   The patient has been instructed regarding the correct time, dose, and frequency of taking this medication, including desired effects and most common side effects.   All questions encouraged and answered.  Instructed patient to call with any further questions or concerns.  Verlin Fester, PharmD, [facility] Rheumatology Clinical Pharmacist  08/27/2018 11:45 AM

## 2018-08-31 NOTE — Telephone Encounter (Signed)
Received fax from [employer] [facility] stating that they reviewed patient's insurance plan and approved coverage for Taltz.  They state that they will reach out to the patient to obtain consent to shipped to patient's home.  Updated patient.  Will send document to scan center.  Phone number [phone] Fax number [phone]

## 2018-10-12 ENCOUNTER — Telehealth: Payer: Self-pay | Admitting: Pharmacy Technician

## 2018-10-12 NOTE — Telephone Encounter (Signed)
Received a Prior Authorization request from [facility] for TALTZ. Authorization has been submitted to patient's insurance via Cover My Meds. Will update once we receive a response.

## 2018-10-15 NOTE — Telephone Encounter (Signed)
Received a fax from [facility] regarding a prior authorization for TALTZ. Authorization has been APPROVED from 10/27/2018 to 10/26/2019.   Will send document to scan center.  Authorization # [id number] Phone # [phone]  Patient's current Auth ends 10/25/2018

## 2018-12-06 ENCOUNTER — Encounter: Payer: Self-pay | Admitting: Rheumatology

## 2018-12-07 NOTE — Progress Notes (Signed)
Office Visit Note  Patient: Jon Henderson             Date of Birth: 09-Sep-1970           MRN: [id number]             PCP: Marva PandaMillsaps, Kimberly, NP Referring: Marva PandaMillsaps, Kimberly, NP Visit Date: 12/13/2018 Occupation: @GUAROCC @  Subjective:  Left shoulder pain    History of Present Illness: Jon Henderson is a 48 y.o. male with history of ankylosing spondylitis and DDD. He is prescribed Taltz 80 mg sq injections every 28 days.  He held [employer] due to the concern for immunosuppression during the COVID-19 pandemic for 1 month.  He took celebrex 200 mg BID and tramadol PRN for pain in relief in the meantime.  He restarted Taltz 2 weeks ago.  He denies any recent flares.  He is having left shoulder joint pain.  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 He denies any achilles tendonitis or plantar fasciitis.  He denies any SI joint pain.  He denies any joint swelling.  He denies any myalgias.  He denies any neuralgia, numbness, or weakness.    CBC WNL on 12/08/18.     Activities of Daily Living:  Patient reports morning stiffness for 2  minutes.   Patient Reports nocturnal pain.  Difficulty dressing/grooming: Denies Difficulty climbing stairs: Denies Difficulty getting out of chair: Denies Difficulty using hands for taps, buttons, cutlery, and/or writing: Denies  Review of Systems  Constitutional: Negative for fatigue and night sweats.  HENT: Negative for mouth sores, mouth dryness and nose dryness.   Eyes: Negative for photophobia, redness, visual disturbance and dryness.  Respiratory: Negative for cough, hemoptysis, shortness of breath and difficulty breathing.   Cardiovascular: Negative for chest pain, palpitations, hypertension, irregular heartbeat and swelling in legs/feet.  Gastrointestinal: Negative for blood in stool, constipation and diarrhea.  Endocrine: Negative for increased urination.  Genitourinary: Negative for painful urination.  Musculoskeletal:  Positive for arthralgias and joint pain. Negative for joint swelling, myalgias, muscle weakness, morning stiffness, muscle tenderness and myalgias.  Skin: Negative for color change, rash, hair loss, nodules/bumps, skin tightness, ulcers and sensitivity to sunlight.  Allergic/Immunologic: Negative for susceptible to infections.  Neurological: Negative for dizziness, fainting, memory loss, night sweats and weakness.  Hematological: Negative for swollen glands.  Psychiatric/Behavioral: Negative for depressed mood and sleep disturbance. The patient is not nervous/anxious.     PMFS History:  Patient Active Problem List   Diagnosis Date Noted  . Ankylosing spondylitis (HCC) 06/12/2016  . Sacroiliitis (HCC) 06/12/2016  . High risk medication use 06/12/2016  . ABNORMAL HEART RHYTHMS 04/20/2008  . DDD (degenerative disc disease), lumbar 04/20/2008  . COUGH 04/20/2008    Past Medical History:  Diagnosis Date  . Ankylosing spondylitis (HCC)   . Disc disorder of lumbar region     Family History  Problem Relation Age of Onset  . Ankylosing spondylitis Mother   . COPD Father   . Drug abuse Sister   . Alcohol abuse Sister   . Stroke Maternal Grandfather   . Healthy Daughter   . ADD / ADHD Daughter   . Healthy Daughter   . ADD / ADHD Daughter   . Healthy Daughter    Past Surgical History:  Procedure Laterality Date  . LUMBAR DISC SURGERY     Social History   Social History Narrative  . Not on file   Immunization History  Administered Date(s) Administered  . Influenza-Unspecified  03/23/2018     Objective: Vital Signs: BP 139/86 (BP Location: Left Arm, Patient Position: Sitting, Cuff Size: Normal)   Pulse 82   Resp 13   Ht 6' (1.829 m)   Wt 198 lb 9.6 oz (90.1 kg)   BMI 26.94 kg/m    Physical Exam Vitals signs and nursing note reviewed.  Constitutional:      Appearance: He is well-developed.  HENT:     Head: Normocephalic and atraumatic.  Eyes:     Conjunctiva/sclera:  Conjunctivae normal.     Pupils: Pupils are equal, round, and reactive to light.  Neck:     Musculoskeletal: Normal range of motion and neck supple.  Cardiovascular:     Rate and Rhythm: Normal rate and regular rhythm.     Heart sounds: Normal heart sounds.  Pulmonary:     Effort: Pulmonary effort is normal.     Breath sounds: Normal breath sounds.  Abdominal:     General: Bowel sounds are normal.     Palpations: Abdomen is soft.  Lymphadenopathy:     Cervical: No cervical adenopathy.  Skin:    General: Skin is warm and dry.     Capillary Refill: Capillary refill takes less than 2 seconds.  Neurological:     Mental Status: He is alert and oriented to person, place, and time.  Psychiatric:        Behavior: Behavior normal.    Musculoskeletal Exam: C-spine good ROM.  Left shoulder discomfort with ROM.  Right shoulder full ROM with no discomfort.  Elbow joints, wrist joints, MCPs, PIPs, and DIPs good ROM with no synovitis.  Hip joints, knee joints, ankle joints, MTPs, PIPs, and DIPs good ROM with no synovitis.  No warmth or effusion of knee joints.  No tenderness or swelling of ankle joints.    CDAI Exam: CDAI Score: Not documented Patient Global Assessment: Not documented; Provider Global Assessment: Not documented Swollen: Not documented; Tender: Not documented Joint Exam   Not documented   There is currently no information documented on the homunculus. Go to the Rheumatology activity and complete the homunculus joint exam.  Investigation: No additional findings.  Imaging: No results found.  Recent Labs: Lab Results  Component Value Date   WBC 5.8 07/16/2018   HGB 15.7 07/16/2018   PLT 270 07/16/2018   NA 140 07/16/2018   K 4.8 07/16/2018   CL 99 07/16/2018   CO2 25 07/16/2018   GLUCOSE 102 (H) 07/16/2018   BUN 16 07/16/2018   CREATININE 0.85 07/16/2018   BILITOT 0.6 07/16/2018   ALKPHOS 64 07/16/2018   AST 23 07/16/2018   ALT 26 07/16/2018   PROT 7.0  07/16/2018   ALBUMIN 4.3 07/16/2018   CALCIUM 9.3 07/16/2018   GFRAA 120 07/16/2018   QFTBGOLDPLUS NEGATIVE 09/15/2017    Speciality Comments: No specialty comments available.  Procedures:  Large Joint Inj: L subacromial bursa on 12/13/2018 1:56 PM Indications: pain Details: 27 G 1.5 in needle, posterior approach  Arthrogram: No  Medications: 1.5 mL lidocaine 1 %; 40 mg triamcinolone acetonide 40 MG/ML Aspirate: 0 mL Outcome: tolerated well, no immediate complications Procedure, treatment alternatives, risks and benefits explained, specific risks discussed. Consent was given by the patient. Immediately prior to procedure a time out was called to verify the correct patient, procedure, equipment, support staff and site/side marked as required. Patient was prepped and draped in the usual sterile fashion.     Allergies: Patient has no known allergies.   Assessment /  Plan:     Visit Diagnoses: Ankylosing spondylitis of multiple sites in spine [facility]): He has not had any recent flares. He has no synovitis on exam. He has no SI joint or midline spinal tenderness at this time.  His morning stiffness has improved significantly.  He is clinically doing well on Taltz 80 mg sq injection every 28 days.  He missed 1 dose due the concern for immunosuppression during the COVID-19 pandemic.  He continues to take Celebrex 200 mg 1 capsule BID and tramadol prn for pain relief.  He will continue injecting Taltz as prescribed.  He does not need any refills at this time.  He was advised to notify if he develops increased joint pain or joint swelling.  He will follow up in 5 months.   Sacroiliitis [facility]): He has no SI joint tenderness at this time.   High risk medication use - Taltz 80 mg sq every 28 days.  CBC WNL 12/08/18.  CMP ordered today.  He is due to update TB gold.  He was advised to hold Taltz if he develops an infection and to resume once the infection has completely cleared.  We discussed the  importance of social distancing and following the standard precautions recommended by the CDC. - Plan: CBC with Differential/Platelet, COMPLETE METABOLIC PANEL WITH GFR, QuantiFERON-TB Gold Plus  DDD (degenerative disc disease), cervical: He has good ROM with no discomfort.  He has no symptoms of radiculopathy at this time.  DDD (degenerative disc disease), lumbar: He has no discomfort at this time. He has good ROM with no midline spinal tenderness.    Subacromial bursitis of left shoulder joint: He has good ROM with discomfort.  Tenderness over the [facility] joint. He has been experiencing nocturnal pain. 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 He has been taking Celebrex 200 mg 1 capsule BID and tramadol PRN for pain relief/ He requested a cortisone injection.  He tolerated the procedure well.  Procedure note completed above.  Aftercare discussed.  He was advised to notify us if his symptoms persist or worsen.    Neuropathy: Resolved.  Myalgia: Resolved.  He has no muscle tenderness, muscle aches, or muscle weakness.    Other fatigue: Stable.  History of gastroesophageal reflux (GERD)   Orders: Orders Placed This Encounter  Procedures  . COMPLETE METABOLIC PANEL WITH GFR  . QuantiFERON-TB Gold Plus   No orders of the defined types were placed in this encounter.   Face-to-face time spent with patient was 30 minutes. Greater than 50% of time was spent in counseling and coordination of care.  Follow-Up Instructions: Return in about 5 months (around 05/15/2019) for Ankylosing Spondylitis, DDD.   Gearldine Bienenstock, PA-C   I examined and evaluated the patient with Sherron Ales PA.  Patient had no synovitis on examination.  He has been having discomfort in his left shoulder which was consistent with left subacromial bursitis.  After different treatment options were discussed and informed consent was obtained left shoulder subacromial bursa was injected with cortisone as  described above.  The plan of care was discussed as noted above.  Pollyann Savoy, MD  Note - This record has been created using Animal nutritionist.  Chart creation errors have been sought, but may not always  have been located. Such creation errors do not reflect on  the standard of medical care.

## 2018-12-13 ENCOUNTER — Ambulatory Visit (INDEPENDENT_AMBULATORY_CARE_PROVIDER_SITE_OTHER): Payer: BLUE CROSS/BLUE SHIELD | Admitting: Physician Assistant

## 2018-12-13 ENCOUNTER — Other Ambulatory Visit: Payer: Self-pay

## 2018-12-13 ENCOUNTER — Encounter: Payer: Self-pay | Admitting: Physician Assistant

## 2018-12-13 VITALS — BP 139/86 | HR 82 | Resp 13 | Ht 72.0 in | Wt 198.6 lb

## 2018-12-13 DIAGNOSIS — Z79899 Other long term (current) drug therapy: Secondary | ICD-10-CM

## 2018-12-13 DIAGNOSIS — M461 Sacroiliitis, not elsewhere classified: Secondary | ICD-10-CM | POA: Diagnosis not present

## 2018-12-13 DIAGNOSIS — M5030 Other cervical disc degeneration, unspecified cervical region: Secondary | ICD-10-CM | POA: Diagnosis not present

## 2018-12-13 DIAGNOSIS — R5383 Other fatigue: Secondary | ICD-10-CM

## 2018-12-13 DIAGNOSIS — G629 Polyneuropathy, unspecified: Secondary | ICD-10-CM

## 2018-12-13 DIAGNOSIS — M5136 Other intervertebral disc degeneration, lumbar region: Secondary | ICD-10-CM

## 2018-12-13 DIAGNOSIS — Z8719 Personal history of other diseases of the digestive system: Secondary | ICD-10-CM

## 2018-12-13 DIAGNOSIS — M450 Ankylosing spondylitis of multiple sites in spine: Secondary | ICD-10-CM | POA: Diagnosis not present

## 2018-12-13 DIAGNOSIS — M7552 Bursitis of left shoulder: Secondary | ICD-10-CM

## 2018-12-13 DIAGNOSIS — M791 Myalgia, unspecified site: Secondary | ICD-10-CM

## 2018-12-13 MED ORDER — TRIAMCINOLONE ACETONIDE 40 MG/ML IJ SUSP
40.0000 mg | INTRAMUSCULAR | Status: AC | PRN
  Administered 2018-12-13: 40 mg via INTRA_ARTICULAR

## 2018-12-13 MED ORDER — LIDOCAINE HCL 1 % IJ SOLN
1.5000 mL | INTRAMUSCULAR | Status: AC | PRN
  Administered 2018-12-13: 1.5 mL

## 2018-12-14 NOTE — Progress Notes (Signed)
CMP WNL

## 2018-12-15 LAB — COMPLETE METABOLIC PANEL WITH GFR
AG Ratio: 1.6 (calc) (ref 1.0–2.5)
ALT: 19 U/L (ref 9–46)
AST: 21 U/L (ref 10–40)
Albumin: 4.2 g/dL (ref 3.6–5.1)
Alkaline phosphatase (APISO): 49 U/L (ref 36–130)
BUN: 24 mg/dL (ref 7–25)
CO2: 28 mmol/L (ref 20–32)
Calcium: 9.2 mg/dL (ref 8.6–10.3)
Chloride: 104 mmol/L (ref 98–110)
Creat: 0.96 mg/dL (ref 0.60–1.35)
GFR, Est African American: 109 mL/min/{1.73_m2} (ref >=60)
GFR, Est Non African American: 94 mL/min/{1.73_m2} (ref >=60)
Globulin: 2.6 g/dL (calc) (ref 1.9–3.7)
Glucose, Bld: 99 mg/dL (ref 65–99)
Potassium: 4.2 mmol/L (ref 3.5–5.3)
Sodium: 139 mmol/L (ref 135–146)
Total Bilirubin: 0.6 mg/dL (ref 0.2–1.2)
Total Protein: 6.8 g/dL (ref 6.1–8.1)

## 2018-12-15 LAB — QUANTIFERON-TB GOLD PLUS
Mitogen-NIL: >10 IU/mL
NIL: 0.02 IU/mL
QuantiFERON-TB Gold Plus: NEGATIVE
TB1-NIL: 0 IU/mL
TB2-NIL: 0.01 IU/mL

## 2018-12-15 NOTE — Progress Notes (Signed)
TB gold negative

## 2019-01-11 ENCOUNTER — Other Ambulatory Visit: Payer: Self-pay | Admitting: Rheumatology

## 2019-01-11 DIAGNOSIS — M458 Ankylosing spondylitis sacral and sacrococcygeal region: Secondary | ICD-10-CM

## 2019-01-11 NOTE — Telephone Encounter (Signed)
Last Visit: 12/13/2018 Next Visit: 05/16/2019 Labs: 12/09/2018 CBC, 12/13/2018 CMP  TB Gold: 12/13/2018 neg   Okay to refill per Dr. Estanislado Pandy.

## 2019-03-28 ENCOUNTER — Encounter: Payer: Self-pay | Admitting: Rheumatology

## 2019-03-29 NOTE — Telephone Encounter (Signed)
Please notify patient that her for an acute injury we can give 5-day supply of tramadol which will be 1 tablet p.o. 3 times daily as needed.  He will get total 15 tablets.  If he needs more than that he will have to come in to the office to sign a narcotic agreement and will do urinary drug screen.  These are new requirements.

## 2019-04-21 ENCOUNTER — Other Ambulatory Visit: Payer: Self-pay | Admitting: Rheumatology

## 2019-04-21 DIAGNOSIS — M458 Ankylosing spondylitis sacral and sacrococcygeal region: Secondary | ICD-10-CM

## 2019-04-22 NOTE — Telephone Encounter (Signed)
Last Visit: 12/13/2018 Next Visit: 05/16/2019 Labs: 12/09/2018 CBC, 12/13/2018 CMP  TB Gold: 12/13/2018 neg   Patient advised he is due to update labs. Patient will come to update labs at the beginning of the week.   Okay to refill 30 day supply per Dr. Estanislado Pandy

## 2019-05-02 NOTE — Progress Notes (Signed)
Office Visit Note  Patient: Jon Henderson             Date of Birth: 04/05/1971           MRN: [id number]             PCP: [name], NP Referring: [name], NP Visit Date: 05/16/2019 Occupation: @GUAROCC @  Subjective:  Medication monitoring   History of Present Illness: Jon Henderson is a 48 y.o. male with history of ankylosing spondylitis and DDD.  He is on taltz 80 mg sq injections every 28 days.  He has not missed any doses of Taltz recently.  His last injection was yesterday.  He takes Celebrex 200 mg 1 capsule BID prn and tramadol very sparingly for pain relief.  He states that typically 5 to 7 days prior to his next injection he experiences increased morning stiffness and SI joint pain.  He states that his morning stiffness typically last about 30 minutes in the morning 1 week prior to his injections.  He states that on the average day he has morning stiffness is 0 minutes.  He experiences increased lower back pain with certain activities such as biking for prolonged periods of time.  He denies any joint pain or joint swelling at this time.  He denies any Achilles denies or plantar fasciitis.  He denies any signs or symptoms of uveitis.  He denies any psoriasis.  Activities of Daily Living:  Patient reports morning stiffness for 0  minutes.   Patient Denies nocturnal pain.  Difficulty dressing/grooming: Denies Difficulty climbing stairs: Denies Difficulty getting out of chair: Denies Difficulty using hands for taps, buttons, cutlery, and/or writing: Denies  Review of Systems  Constitutional: Negative for fatigue and night sweats.  HENT: Negative for mouth sores, mouth dryness and nose dryness.   Eyes: Negative for pain, redness, itching, visual disturbance and dryness.  Respiratory: Negative for cough, hemoptysis, shortness of breath and difficulty breathing.   Cardiovascular: Negative for chest pain, palpitations, hypertension, irregular heartbeat and  swelling in legs/feet.  Gastrointestinal: Negative for blood in stool, constipation and diarrhea.  Endocrine: Negative for increased urination.  Genitourinary: Negative for painful urination.  Musculoskeletal: Negative for arthralgias, joint pain, joint swelling, myalgias, muscle weakness, morning stiffness, muscle tenderness and myalgias.  Skin: Negative for color change, rash, hair loss, nodules/bumps, skin tightness, ulcers and sensitivity to sunlight.  Allergic/Immunologic: Negative for susceptible to infections.  Neurological: Negative for dizziness, fainting, memory loss, night sweats and weakness.  Hematological: Negative for swollen glands.  Psychiatric/Behavioral: Negative for depressed mood and sleep disturbance. The patient is not nervous/anxious.     PMFS History:  Patient Active Problem List   Diagnosis Date Noted  . Ankylosing spondylitis (HCC) 06/12/2016  . Sacroiliitis (HCC) 06/12/2016  . High risk medication use 06/12/2016  . ABNORMAL HEART RHYTHMS 04/20/2008  . DDD (degenerative disc disease), lumbar 04/20/2008  . COUGH 04/20/2008    Past Medical History:  Diagnosis Date  . Ankylosing spondylitis (HCC)   . Disc disorder of lumbar region     Family History  Problem Relation Age of Onset  . Ankylosing spondylitis Mother   . COPD Father   . Drug abuse Sister   . Alcohol abuse Sister   . Stroke Maternal Grandfather   . Healthy Daughter   . ADD / ADHD Daughter   . Healthy Daughter   . ADD / ADHD Daughter   . Healthy Daughter    Past Surgical History:  Procedure Laterality  Date  . LUMBAR DISC SURGERY     Social History   Social History Narrative  . Not on file   Immunization History  Administered Date(s) Administered  . Influenza-Unspecified 03/23/2018     Objective: Vital Signs: BP 131/82 (BP Location: Right Arm, Patient Position: Sitting, Cuff Size: Normal)   Pulse 84   Resp 13   Ht 6' (1.829 m)   Wt 199 lb 3.2 oz (90.4 kg)   BMI 27.02 kg/m     Physical Exam Vitals signs and nursing note reviewed.  Constitutional:      Appearance: He is well-developed.  HENT:     Head: Normocephalic and atraumatic.  Eyes:     Conjunctiva/sclera: Conjunctivae normal.     Pupils: Pupils are equal, round, and reactive to light.  Neck:     Musculoskeletal: Normal range of motion and neck supple.  Cardiovascular:     Rate and Rhythm: Normal rate and regular rhythm.     Heart sounds: Normal heart sounds.  Pulmonary:     Effort: Pulmonary effort is normal.     Breath sounds: Normal breath sounds.  Abdominal:     General: Bowel sounds are normal.     Palpations: Abdomen is soft.  Skin:    General: Skin is warm and dry.     Capillary Refill: Capillary refill takes less than 2 seconds.  Neurological:     Mental Status: He is alert and oriented to person, place, and time.  Psychiatric:        Behavior: Behavior normal.      Musculoskeletal Exam: C-spine, thoracic spine, lumbar spine good range of motion.  No midline spinal tenderness.  No SI joint tenderness.  Shoulder joints, elbow joints, wrist joints, MCPs, PIPs, DIPs good range of motion with no synovitis.  Hip joints, knee joints and ankle joints, MTPs, PIPs, DIPs good range of motion no synovitis.  No warmth or effusion of bilateral knee joints.  No tenderness or swelling of ankle joints.  No Achilles tendinitis or plantar fasciitis.  CDAI Exam: CDAI Score: - Patient Global: -; Provider Global: - Swollen: -; Tender: - Joint Exam   No joint exam has been documented for this visit   There is currently no information documented on the homunculus. Go to the Rheumatology activity and complete the homunculus joint exam.  Investigation: No additional findings.  Imaging: No results found.  Recent Labs: Lab Results  Component Value Date   WBC 5.8 07/16/2018   HGB 15.7 07/16/2018   PLT 270 07/16/2018   NA 139 12/13/2018   K 4.2 12/13/2018   CL 104 12/13/2018   CO2 28 12/13/2018    GLUCOSE 99 12/13/2018   BUN 24 12/13/2018   CREATININE 0.96 12/13/2018   BILITOT 0.6 12/13/2018   ALKPHOS 64 07/16/2018   AST 21 12/13/2018   ALT 19 12/13/2018   PROT 6.8 12/13/2018   ALBUMIN 4.3 07/16/2018   CALCIUM 9.2 12/13/2018   GFRAA 109 12/13/2018   QFTBGOLDPLUS NEGATIVE 12/13/2018    Speciality Comments: No specialty comments available.  Procedures:  No procedures performed Allergies: Patient has no known allergies.   Assessment / Plan:     Visit Diagnoses: Ankylosing spondylitis of multiple sites in spine [facility](HCC): He has not had any recent ankylosing spondylitis flares.  He is clinically doing well on Taltz 80 mg subcutaneous injections every 28 days.  His last injection was yesterday.  He typically experiences increased morning stiffness and bilateral SI joint pain about 5  to 7 days prior to his next injection.  He states he takes Celebrex 200 mg twice daily or tramadol very sparingly for pain relief.  He requested refills of both medications today.  He has no midline spinal tenderness.  No SI joint tenderness noted.  He has no synovitis or dactylitis on exam.  He has no Achilles tendinitis or plantar fasciitis.  He will continue on Taltz as prescribed.  We will update lab work today.  He was advised to notify us if he develops increased joint pain or joint swelling.  He will follow up in the office in 5 months.  Sacroiliitis [facility]): He has no SI joint tenderness on exam today.  High risk medication use - Taltz 80 mg sq every 28 days.  Last TB gold negative on 12/13/2018 and will monitor yearly.  Most recent CBC/CMP within normal limits on 12/13/2018.  Due for CBC/CMP today and will monitor every 3 months.- Plan: CBC with Differential/Platelet, COMPLETE METABOLIC PANEL WITH GFR  DDD (degenerative disc disease), cervical: He has good range of motion with no discomfort.  He has no symptoms of radiculopathy at this time.  DDD (degenerative disc disease), thoracic: No midline spinal  tenderness.  DDD (degenerative disc disease), lumbar: He has no lower back pain at this time.  He has good range of motion with no discomfort.  No midline spinal tenderness.  No SI joint tenderness.  He experiences some morning stiffness and increased lower back pain with strenuous activity.  Subacromial bursitis of left shoulder joint: He has good range of motion with no discomfort at this time.  Neuropathy: Resolved  Myalgia: Resolved.  He has no muscle aches or muscle tenderness at this time.  He has no muscle weakness heat.  He has started to workout on a regular basis.  Other fatigue: He has no fatigue at this time.  History of gastroesophageal reflux (GERD)  Orders: Orders Placed This Encounter  Procedures  . CBC with Differential/Platelet  . COMPLETE METABOLIC PANEL WITH GFR   Meds ordered this encounter  Medications  . traMADol (ULTRAM) 50 MG tablet    Sig: Take 2 tablets (100 mg total) by mouth 3 (three) times daily as needed.    Dispense:  30 tablet    Refill:  0  . celecoxib (CELEBREX) 200 MG capsule    Sig: TAKE 1 CAPSULE BY MOUTH 2 TIMES DAILY AS NEEDED    Dispense:  180 capsule    Refill:  0    Face-to-face time spent with patient was 30 minutes. Greater than 50% of time was spent in counseling and coordination of care.  Follow-Up Instructions: Return in about 5 months (around 10/14/2019) for Ankylosing Spondylitis, DDD.   Ofilia Neas, PA-C  Note - This record has been created using Dragon software.  Chart creation errors have been sought, but may not always  have been located. Such creation errors do not reflect on  the standard of medical care.

## 2019-05-16 ENCOUNTER — Ambulatory Visit (INDEPENDENT_AMBULATORY_CARE_PROVIDER_SITE_OTHER): Payer: BC Managed Care – PPO | Admitting: Physician Assistant

## 2019-05-16 ENCOUNTER — Encounter: Payer: Self-pay | Admitting: Physician Assistant

## 2019-05-16 ENCOUNTER — Other Ambulatory Visit: Payer: Self-pay | Admitting: Rheumatology

## 2019-05-16 ENCOUNTER — Other Ambulatory Visit: Payer: Self-pay

## 2019-05-16 VITALS — BP 131/82 | HR 84 | Resp 13 | Ht 72.0 in | Wt 199.2 lb

## 2019-05-16 DIAGNOSIS — M5030 Other cervical disc degeneration, unspecified cervical region: Secondary | ICD-10-CM | POA: Diagnosis not present

## 2019-05-16 DIAGNOSIS — M450 Ankylosing spondylitis of multiple sites in spine: Secondary | ICD-10-CM | POA: Diagnosis not present

## 2019-05-16 DIAGNOSIS — Z79899 Other long term (current) drug therapy: Secondary | ICD-10-CM | POA: Diagnosis not present

## 2019-05-16 DIAGNOSIS — M7552 Bursitis of left shoulder: Secondary | ICD-10-CM

## 2019-05-16 DIAGNOSIS — M5136 Other intervertebral disc degeneration, lumbar region: Secondary | ICD-10-CM

## 2019-05-16 DIAGNOSIS — M791 Myalgia, unspecified site: Secondary | ICD-10-CM

## 2019-05-16 DIAGNOSIS — Z8719 Personal history of other diseases of the digestive system: Secondary | ICD-10-CM

## 2019-05-16 DIAGNOSIS — G629 Polyneuropathy, unspecified: Secondary | ICD-10-CM

## 2019-05-16 DIAGNOSIS — M461 Sacroiliitis, not elsewhere classified: Secondary | ICD-10-CM

## 2019-05-16 DIAGNOSIS — R5383 Other fatigue: Secondary | ICD-10-CM

## 2019-05-16 DIAGNOSIS — M5134 Other intervertebral disc degeneration, thoracic region: Secondary | ICD-10-CM

## 2019-05-16 DIAGNOSIS — M458 Ankylosing spondylitis sacral and sacrococcygeal region: Secondary | ICD-10-CM

## 2019-05-16 MED ORDER — CELECOXIB 200 MG PO CAPS: ORAL_CAPSULE | ORAL | 0 refills | Status: DC

## 2019-05-16 MED ORDER — TRAMADOL HCL 50 MG PO TABS: 100.0000 mg | ORAL_TABLET | Freq: Three times a day (TID) | ORAL | 0 refills | Status: DC | PRN

## 2019-05-17 LAB — COMPLETE METABOLIC PANEL WITH GFR
AG Ratio: 1.8 (calc) (ref 1.0–2.5)
ALT: 26 U/L (ref 9–46)
AST: 23 U/L (ref 10–40)
Albumin: 4.4 g/dL (ref 3.6–5.1)
Alkaline phosphatase (APISO): 52 U/L (ref 36–130)
BUN: 13 mg/dL (ref 7–25)
CO2: 26 mmol/L (ref 20–32)
Calcium: 9.3 mg/dL (ref 8.6–10.3)
Chloride: 104 mmol/L (ref 98–110)
Creat: 0.96 mg/dL (ref 0.60–1.35)
GFR, Est African American: 108 mL/min/{1.73_m2} (ref >=60)
GFR, Est Non African American: 93 mL/min/{1.73_m2} (ref >=60)
Globulin: 2.4 g/dL (calc) (ref 1.9–3.7)
Glucose, Bld: 89 mg/dL (ref 65–99)
Potassium: 4.3 mmol/L (ref 3.5–5.3)
Sodium: 140 mmol/L (ref 135–146)
Total Bilirubin: 0.4 mg/dL (ref 0.2–1.2)
Total Protein: 6.8 g/dL (ref 6.1–8.1)

## 2019-05-17 LAB — CBC WITH DIFFERENTIAL/PLATELET
Absolute Monocytes: 654 cells/uL (ref 200–950)
Basophils Absolute: 42 cells/uL (ref 0–200)
Basophils Relative: 0.7 %
Eosinophils Absolute: 30 cells/uL (ref 15–500)
Eosinophils Relative: 0.5 %
HCT: 45.3 % (ref 38.5–50.0)
Hemoglobin: 15.4 g/dL (ref 13.2–17.1)
Lymphs Abs: 1386 cells/uL (ref 850–3900)
MCH: 31.8 pg (ref 27.0–33.0)
MCHC: 34 g/dL (ref 32.0–36.0)
MCV: 93.6 fL (ref 80.0–100.0)
MPV: 10.2 fL (ref 7.5–12.5)
Monocytes Relative: 10.9 %
Neutro Abs: 3888 cells/uL (ref 1500–7800)
Neutrophils Relative %: 64.8 %
Platelets: 242 10*3/uL (ref 140–400)
RBC: 4.84 10*6/uL (ref 4.20–5.80)
RDW: 12.2 % (ref 11.0–15.0)
Total Lymphocyte: 23.1 %
WBC: 6 10*3/uL (ref 3.8–10.8)

## 2019-05-17 NOTE — Progress Notes (Signed)
CBC and CMP WNL

## 2019-05-17 NOTE — Telephone Encounter (Signed)
Last visit: 05/16/19 Next Visit: 10/11/19 Labs: 05/16/19 WNL TB gold: 12/13/18 Neg   Okay to refill per Dr. Estanislado Pandy

## 2019-08-09 ENCOUNTER — Other Ambulatory Visit: Payer: Self-pay | Admitting: Physician Assistant

## 2019-08-09 NOTE — Telephone Encounter (Signed)
Last visit: 05/16/19 Next Visit: 10/11/19 Labs: 05/16/19 WNL  Okay to refill per Dr. Corliss Skains

## 2019-09-06 ENCOUNTER — Other Ambulatory Visit: Payer: Self-pay | Admitting: Rheumatology

## 2019-09-06 DIAGNOSIS — M458 Ankylosing spondylitis sacral and sacrococcygeal region: Secondary | ICD-10-CM

## 2019-09-06 NOTE — Telephone Encounter (Signed)
Last visit: 05/16/19 Next Visit: 10/11/19 Labs: 05/16/19 WNL TB gold: 12/13/18 Neg   Left message to advise patient he is due to update labs.   Okay to refill 30 day supply per Dr. Corliss Skains

## 2019-09-12 ENCOUNTER — Other Ambulatory Visit: Payer: Self-pay

## 2019-09-12 DIAGNOSIS — Z79899 Other long term (current) drug therapy: Secondary | ICD-10-CM

## 2019-09-13 ENCOUNTER — Other Ambulatory Visit: Payer: Self-pay

## 2019-09-13 DIAGNOSIS — M458 Ankylosing spondylitis sacral and sacrococcygeal region: Secondary | ICD-10-CM

## 2019-09-13 LAB — COMPLETE METABOLIC PANEL WITH GFR
AG Ratio: 1.7 (calc) (ref 1.0–2.5)
ALT: 18 U/L (ref 9–46)
AST: 19 U/L (ref 10–40)
Albumin: 4.3 g/dL (ref 3.6–5.1)
Alkaline phosphatase (APISO): 54 U/L (ref 36–130)
BUN: 15 mg/dL (ref 7–25)
CO2: 25 mmol/L (ref 20–32)
Calcium: 9.3 mg/dL (ref 8.6–10.3)
Chloride: 104 mmol/L (ref 98–110)
Creat: 0.82 mg/dL (ref 0.60–1.35)
GFR, Est African American: 121 mL/min/{1.73_m2} (ref >=60)
GFR, Est Non African American: 105 mL/min/{1.73_m2} (ref >=60)
Globulin: 2.6 g/dL (calc) (ref 1.9–3.7)
Glucose, Bld: 108 mg/dL — ABNORMAL HIGH (ref 65–99)
Potassium: 4.2 mmol/L (ref 3.5–5.3)
Sodium: 139 mmol/L (ref 135–146)
Total Bilirubin: 0.5 mg/dL (ref 0.2–1.2)
Total Protein: 6.9 g/dL (ref 6.1–8.1)

## 2019-09-13 LAB — CBC WITH DIFFERENTIAL/PLATELET
Absolute Monocytes: 638 cells/uL (ref 200–950)
Basophils Absolute: 17 cells/uL (ref 0–200)
Basophils Relative: 0.3 %
Eosinophils Absolute: 17 cells/uL (ref 15–500)
Eosinophils Relative: 0.3 %
HCT: 44.7 % (ref 38.5–50.0)
Hemoglobin: 15.3 g/dL (ref 13.2–17.1)
Lymphs Abs: 1067 cells/uL (ref 850–3900)
MCH: 31.9 pg (ref 27.0–33.0)
MCHC: 34.2 g/dL (ref 32.0–36.0)
MCV: 93.1 fL (ref 80.0–100.0)
MPV: 9.7 fL (ref 7.5–12.5)
Monocytes Relative: 11 %
Neutro Abs: 4060 cells/uL (ref 1500–7800)
Neutrophils Relative %: 70 %
Platelets: 256 10*3/uL (ref 140–400)
RBC: 4.8 10*6/uL (ref 4.20–5.80)
RDW: 12.7 % (ref 11.0–15.0)
Total Lymphocyte: 18.4 %
WBC: 5.8 10*3/uL (ref 3.8–10.8)

## 2019-09-13 MED ORDER — TALTZ 80 MG/ML ~~LOC~~ SOAJ: 80.0000 mg | SUBCUTANEOUS | 0 refills | Status: DC

## 2019-09-13 NOTE — Progress Notes (Signed)
Last Visit: 05/16/2019 Next Visit: 10/11/2019 Labs: 09/12/2019 Labs are within normal limits. Glucose is mildly elevated probably was not fasting.  Okay to refill per Dr. Corliss Skains.

## 2019-09-13 NOTE — Progress Notes (Signed)
Labs are within normal limits.  Glucose is mildly elevated probably was not fasting.

## 2019-10-03 NOTE — Progress Notes (Signed)
Office Visit Note  Patient: Jon Henderson             Date of Birth: June 02, 1971           MRN: [id number]             PCP: [name], NP Referring: [name], NP Visit Date: 10/11/2019 Occupation: @GUAROCC @  Subjective:  Medication monitoring   History of Present Illness: Jon Henderson is a 49 y.o. male with history of ankylosing spondylitis and DDD.  He is on Taltz 80 mg sq injections once every 28 days. He denies any recent flares. He has occasional arthralgias which is directly related to his level of activity.  He went skiing in [place] last week.  He remains active and exercises on a regular basis.  He denies any joint stiffness or nocturnal pain.     Activities of Daily Living:  Patient reports morning stiffness for 0 minutes.   Patient Denies nocturnal pain.  Difficulty dressing/grooming: Denies Difficulty climbing stairs: Denies Difficulty getting out of chair: Denies Difficulty using hands for taps, buttons, cutlery, and/or writing: Denies  Review of Systems  Constitutional: Negative for fatigue and night sweats.  HENT: Negative for mouth sores, mouth dryness and nose dryness.   Eyes: Negative for redness, itching and dryness.  Respiratory: Negative for cough, hemoptysis, shortness of breath and difficulty breathing.   Cardiovascular: Negative for chest pain, palpitations, hypertension, irregular heartbeat and swelling in legs/feet.  Gastrointestinal: Positive for diarrhea. Negative for blood in stool and constipation.  Endocrine: Negative for increased urination.  Genitourinary: Negative for difficulty urinating and painful urination.  Musculoskeletal: Negative for arthralgias, joint pain, joint swelling, myalgias, muscle weakness, morning stiffness, muscle tenderness and myalgias.  Skin: Negative for color change, rash, hair loss, nodules/bumps, skin tightness, ulcers and sensitivity to sunlight.  Allergic/Immunologic: Negative for susceptible to  infections.  Neurological: Negative for dizziness, fainting, headaches, memory loss, night sweats and weakness.  Hematological: Negative for bruising/bleeding tendency and swollen glands.  Psychiatric/Behavioral: Negative for depressed mood, confusion and sleep disturbance. The patient is not nervous/anxious.     PMFS History:  Patient Active Problem List   Diagnosis Date Noted  . Ankylosing spondylitis (HCC) 06/12/2016  . Sacroiliitis (HCC) 06/12/2016  . High risk medication use 06/12/2016  . ABNORMAL HEART RHYTHMS 04/20/2008  . DDD (degenerative disc disease), lumbar 04/20/2008  . COUGH 04/20/2008    Past Medical History:  Diagnosis Date  . Ankylosing spondylitis (HCC)   . Disc disorder of lumbar region     Family History  Problem Relation Age of Onset  . Ankylosing spondylitis Mother   . COPD Father   . Drug abuse Sister   . Alcohol abuse Sister   . Stroke Maternal Grandfather   . Healthy Daughter   . ADD / ADHD Daughter   . Healthy Daughter   . ADD / ADHD Daughter   . Healthy Daughter    Past Surgical History:  Procedure Laterality Date  . LUMBAR DISC SURGERY     Social History   Social History Narrative  . Not on file   Immunization History  Administered Date(s) Administered  . Influenza-Unspecified 03/23/2018     Objective: Vital Signs: BP (!) 147/94 (BP Location: Right Arm, Patient Position: Sitting, Cuff Size: Normal)   Pulse 89   Resp 14   Ht 6' (1.829 m)   Wt 200 lb 3.2 oz (90.8 kg)   BMI 27.15 kg/m    Physical Exam Vitals and nursing  note reviewed.  Constitutional:      Appearance: He is well-developed.  HENT:     Head: Normocephalic and atraumatic.  Eyes:     Conjunctiva/sclera: Conjunctivae normal.     Pupils: Pupils are equal, round, and reactive to light.  Pulmonary:     Effort: Pulmonary effort is normal.  Abdominal:     General: Bowel sounds are normal.     Palpations: Abdomen is soft.  Musculoskeletal:     Cervical back: Normal  range of motion and neck supple.  Skin:    General: Skin is warm and dry.     Capillary Refill: Capillary refill takes less than 2 seconds.  Neurological:     Mental Status: He is alert and oriented to person, place, and time.  Psychiatric:        Behavior: Behavior normal.      Musculoskeletal Exam: C-spine, thoracic spine, and lumbar spine good ROM with no synovitis.  No midline spinal tenderness.  No SI joint tenderness.  Shoulder joints, elbow joints, MCPs, PIPs, and DIPs good ROM with no synovitis.  Complete fist formation bilaterally.  Hip joints, knee joints, ankle joints, MTPs, PIPs, and DIPs good ROM with no synovitis.  No warmth or effusion of knee joints. No tenderness or swelling of ankle joints.   CDAI Exam: CDAI Score: -- Patient Global: --; Provider Global: -- Swollen: --; Tender: -- Joint Exam 10/11/2019   No joint exam has been documented for this visit   There is currently no information documented on the homunculus. Go to the Rheumatology activity and complete the homunculus joint exam.  Investigation: No additional findings.  Imaging: No results found.  Recent Labs: Lab Results  Component Value Date   WBC 5.8 09/12/2019   HGB 15.3 09/12/2019   PLT 256 09/12/2019   NA 139 09/12/2019   K 4.2 09/12/2019   CL 104 09/12/2019   CO2 25 09/12/2019   GLUCOSE 108 (H) 09/12/2019   BUN 15 09/12/2019   CREATININE 0.82 09/12/2019   BILITOT 0.5 09/12/2019   ALKPHOS 64 07/16/2018   AST 19 09/12/2019   ALT 18 09/12/2019   PROT 6.9 09/12/2019   ALBUMIN 4.3 07/16/2018   CALCIUM 9.3 09/12/2019   GFRAA 121 09/12/2019   QFTBGOLDPLUS NEGATIVE 12/13/2018    Speciality Comments: No specialty comments available.  Procedures:  No procedures performed Allergies: Patient has no known allergies.   Assessment / Plan:     Visit Diagnoses: Ankylosing spondylitis of multiple sites in spine [facility]): He has not had any recent ankylosing spondylitis flares.  He has good ROM  of the C-spine, thoracic spine, and lumbar spine with no discomfort.  No midline spinal tenderness.  No SI joint tenderness.  He has not been experiencing any morning stiffness or nocturnal pain.  He has no synovitis on exam.   He remains very active and exercises on a regular basis.  He was able to go skiing in [place] last week.  He is clinically doing well on Taltz 80 mg sq injections every 28 days.  He has not missed any doses recently.  He has not had any achilles tendonitis or plantar fasciitis. No signs or symptoms of a uveitis flare. He will continue on the current treatment regimen. He was advised to notify us if he has signs or symptoms of a flare.  He will follow up in 5 months.  High risk medication use - Taltz 80 mg sq injections every 28 days.  Last TB gold  negative on 12/13/2018 and will monitor yearly. Future order for TB gold placed today. CBC and CMP WNL on 09/12/19.  He is due to update lab work in May and every 3 months.  Standing orders are in place.  - Plan: QuantiFERON-TB Gold Plus  Sacroiliitis [facility]): He has no SI joint tenderness.   DDD (degenerative disc disease), cervical: He has good ROM with no discomfort. No symptoms of radiculopathy at this time.   DDD (degenerative disc disease), thoracic: No midline spinal tenderness.    DDD (degenerative disc disease), lumbar: He has good ROM with no discomfort at this time.  No midline spinal tenderness.  No symptoms of radiculopathy at this time.   Subacromial bursitis of left shoulder joint: He has good ROM with no discomfort at this time.   Myalgia - Resolved. He has been exercising on a regular basis and has started to gain his muscle strength back.  Neuropathy - His symptoms continue to improve.  Other medical conditions are listed as follows:   Other fatigue  History of gastroesophageal reflux (GERD)  Orders: Orders Placed This Encounter  Procedures  . QuantiFERON-TB Gold Plus   No orders of the defined types were  placed in this encounter.    Follow-Up Instructions: Return in about 5 months (around 03/12/2020) for Ankylosing Spondylitis, DDD.   Ofilia Neas, PA-C  Note - This record has been created using Dragon software.  Chart creation errors have been sought, but may not always  have been located. Such creation errors do not reflect on  the standard of medical care.

## 2019-10-11 ENCOUNTER — Ambulatory Visit (INDEPENDENT_AMBULATORY_CARE_PROVIDER_SITE_OTHER): Payer: BC Managed Care – PPO | Admitting: Physician Assistant

## 2019-10-11 ENCOUNTER — Other Ambulatory Visit: Payer: Self-pay

## 2019-10-11 ENCOUNTER — Encounter: Payer: Self-pay | Admitting: Physician Assistant

## 2019-10-11 VITALS — BP 147/94 | HR 89 | Resp 14 | Ht 72.0 in | Wt 200.2 lb

## 2019-10-11 DIAGNOSIS — M7552 Bursitis of left shoulder: Secondary | ICD-10-CM

## 2019-10-11 DIAGNOSIS — G629 Polyneuropathy, unspecified: Secondary | ICD-10-CM

## 2019-10-11 DIAGNOSIS — R5383 Other fatigue: Secondary | ICD-10-CM

## 2019-10-11 DIAGNOSIS — M461 Sacroiliitis, not elsewhere classified: Secondary | ICD-10-CM

## 2019-10-11 DIAGNOSIS — Z79899 Other long term (current) drug therapy: Secondary | ICD-10-CM

## 2019-10-11 DIAGNOSIS — M450 Ankylosing spondylitis of multiple sites in spine: Secondary | ICD-10-CM

## 2019-10-11 DIAGNOSIS — M5136 Other intervertebral disc degeneration, lumbar region: Secondary | ICD-10-CM

## 2019-10-11 DIAGNOSIS — M5134 Other intervertebral disc degeneration, thoracic region: Secondary | ICD-10-CM

## 2019-10-11 DIAGNOSIS — Z8719 Personal history of other diseases of the digestive system: Secondary | ICD-10-CM

## 2019-10-11 DIAGNOSIS — M5030 Other cervical disc degeneration, unspecified cervical region: Secondary | ICD-10-CM | POA: Diagnosis not present

## 2019-10-11 DIAGNOSIS — M791 Myalgia, unspecified site: Secondary | ICD-10-CM

## 2019-10-26 ENCOUNTER — Telehealth: Payer: Self-pay | Admitting: Rheumatology

## 2019-10-26 NOTE — Telephone Encounter (Signed)
Patient called stating he has new insurance [employer]) which will be active tomorrow, 10/27/19.  Patient states he can email a copy of his temporary card so the paperwork for Talz medication can get started.  Patient requested a return call.

## 2019-10-26 NOTE — Telephone Encounter (Signed)
Patient advised to take picture and upload to my chart. Will send to [employer] so they can work on the [employer]. Patient verbalized understanding.

## 2019-10-27 ENCOUNTER — Telehealth: Payer: Self-pay | Admitting: Rheumatology

## 2019-10-27 NOTE — Telephone Encounter (Signed)
Thanks!  Closing encounter.

## 2019-10-27 NOTE — Telephone Encounter (Signed)
Burna Mortimer from [employer] called stating prior authorization for Altamease Oiler has been approved.  Approval dates are 10/27/2019 to 10/25/2020.  Burna Mortimer states this requires manual loading into the system and need time to complete their side.  If you have any questions, please call #[phone] option 3 option 1

## 2019-10-27 NOTE — Telephone Encounter (Signed)
Submitted a Prior Authorization request to [facility] for TALTZ via Cover My Meds. Will update once we receive a response.  (Key: [id number])

## 2019-11-18 NOTE — Telephone Encounter (Signed)
Medication Samples have been provided to the patient.  Drug name: taltz Strength: 80mg     Qty: 1 LOT AA  Exp.Date: 03/2021  Dosing instructions: Inject 80 mg into the skin every 28 (twenty-eight) days.  The patient has been instructed regarding the correct time, dose, and frequency of taking this medication, including desired effects and most common side effects.   Aaliyah Gavel C Kiyra Slaubaugh 2:51 PM 11/18/2019

## 2019-12-01 NOTE — Telephone Encounter (Signed)
Please advise the patient to update UDS and narcotic agreement.  Ok to send in refill once updated.

## 2019-12-01 NOTE — Telephone Encounter (Signed)
Please advise the patient to wait to perform the UDS.  We will not be able to refill tramadol until the UDS is updated.

## 2019-12-02 MED ORDER — CYCLOBENZAPRINE HCL 10 MG PO TABS: 10.0000 mg | ORAL_TABLET | Freq: Every day | ORAL | 0 refills | Status: DC

## 2019-12-02 NOTE — Telephone Encounter (Signed)
Please advise 

## 2019-12-02 NOTE — Telephone Encounter (Signed)
I had a detailed discussion with the patient.  He states he has been having a lot of back spasms.  He understands that marijuana is not legal in [place] and he would not be able to use it here.  He stated that when he tried and it was very effective.  He would like to try it in the future if it becomes legal in [place] .  I advised him that in that case he may want to look for a primary care physician or pain management provider who can prescribe marijuana when it becomes legal here.  He will need a provider who is certified to prescribe marijuana.  He was in agreement.  At this point he is only interested in getting tramadol prescription after he gets his UDS.  He will come in for a UDS in about a month.  I agreed to refill tramadol as it was a clear misunderstanding about the legality of marijuana in [place] versus [place] for him.  In the meantime he wants to try muscle relaxer.  I will give him a prescription for Flexeril 10 mg p.o. nightly as needed total 30 tablets with no refills.

## 2019-12-29 ENCOUNTER — Other Ambulatory Visit: Payer: Self-pay | Admitting: Rheumatology

## 2019-12-29 DIAGNOSIS — M458 Ankylosing spondylitis sacral and sacrococcygeal region: Secondary | ICD-10-CM

## 2019-12-29 MED ORDER — TALTZ 80 MG/ML ~~LOC~~ SOAJ: 80.0000 mg | SUBCUTANEOUS | 0 refills | Status: DC

## 2019-12-29 NOTE — Telephone Encounter (Signed)
ok 

## 2019-12-29 NOTE — Telephone Encounter (Signed)
Pharmacy requesting refill on Taltz. Fax # [phone]

## 2019-12-29 NOTE — Telephone Encounter (Signed)
Last Visit: 10/11/2019 Next Visit: 03/06/2020 Labs: 09/12/2019 WNL TB Gold: 12/13/2018 Neg   Current Dose per office note : Taltz 80 mg sq injections every 28 days  Patient advised he is due to update labs. Patient will come to office this week to update.  Okay to refill 30 day supply Taltz?

## 2020-01-26 ENCOUNTER — Other Ambulatory Visit: Payer: Self-pay | Admitting: Rheumatology

## 2020-01-26 DIAGNOSIS — M458 Ankylosing spondylitis sacral and sacrococcygeal region: Secondary | ICD-10-CM

## 2020-01-27 NOTE — Telephone Encounter (Signed)
Last Visit: 10/11/2019 Next Visit: 03/06/2020 Labs: 09/12/2019 WNL TB Gold: 12/13/2018 Neg   Current Dose per office note : Taltz 80 mg sqinjectionsevery 28 days  Left message to advise patient he is due to update labs.   Current Dose per office note on 10/11/2019: Taltz 80 mg sq injections every 28 days  Okay to refill Taltz?

## 2020-02-21 NOTE — Progress Notes (Signed)
Office Visit Note  Patient: Jon Henderson             Date of Birth: 11-23-1970           MRN: [id number]             PCP: [name], NP Referring: [name], NP Visit Date: 03/06/2020 Occupation: @GUAROCC @  Subjective:  Medication monitoring.   History of Present Illness: Jon Henderson is a 49 y.o. male with history of ankylosing spondylitis, osteoarthritis and degenerative disc disease.  He states he has been doing well without any joint pain or joint swelling.  He has been tolerating Taltz well.  There is no history of plantar fasciitis or Achilles tendinitis recently.  He has been working long hours as he is not getting enough employee support.  Activities of Daily Living:  Patient reports morning stiffness for 0 minutes.   Patient Denies nocturnal pain.  Difficulty dressing/grooming: Denies Difficulty climbing stairs: Denies Difficulty getting out of chair: Denies Difficulty using hands for taps, buttons, cutlery, and/or writing: Denies  Review of Systems  Constitutional: Negative for fatigue.  HENT: Negative for mouth sores, mouth dryness and nose dryness.   Eyes: Negative for itching and dryness.  Respiratory: Negative for shortness of breath and difficulty breathing.   Cardiovascular: Negative for chest pain and palpitations.  Gastrointestinal: Negative for blood in stool, constipation and diarrhea.  Endocrine: Negative for increased urination.  Genitourinary: Negative for difficulty urinating.  Musculoskeletal: Negative for arthralgias, joint pain, joint swelling, myalgias, morning stiffness, muscle tenderness and myalgias.  Skin: Negative for color change, rash and redness.  Allergic/Immunologic: Negative for susceptible to infections.  Neurological: Negative for dizziness, numbness, headaches, memory loss and weakness.  Hematological: Positive for bruising/bleeding tendency.  Psychiatric/Behavioral: Positive for sleep disturbance. Negative for  confusion.    PMFS History:  Patient Active Problem List   Diagnosis Date Noted  . Ankylosing spondylitis (HCC) 06/12/2016  . Sacroiliitis (HCC) 06/12/2016  . High risk medication use 06/12/2016  . ABNORMAL HEART RHYTHMS 04/20/2008  . DDD (degenerative disc disease), lumbar 04/20/2008  . COUGH 04/20/2008    Past Medical History:  Diagnosis Date  . Ankylosing spondylitis (HCC)   . Disc disorder of lumbar region     Family History  Problem Relation Age of Onset  . Ankylosing spondylitis Mother   . COPD Father   . Drug abuse Sister   . Alcohol abuse Sister   . Stroke Maternal Grandfather   . Healthy Daughter   . ADD / ADHD Daughter   . Healthy Daughter   . ADD / ADHD Daughter   . Healthy Daughter    Past Surgical History:  Procedure Laterality Date  . LUMBAR DISC SURGERY     Social History   Social History Narrative  . Not on file   Immunization History  Administered Date(s) Administered  . Influenza-Unspecified 03/23/2018  . PFIZER SARS-COV-2 Vaccination 08/29/2019, 09/19/2019     Objective: Vital Signs: BP 128/79 (BP Location: Left Arm, Patient Position: Sitting, Cuff Size: Normal)   Pulse 74   Ht 6' (1.829 m)   Wt 194 lb 12.8 oz (88.4 kg)   BMI 26.42 kg/m    Physical Exam Vitals and nursing note reviewed.  Constitutional:      Appearance: He is well-developed.  HENT:     Head: Normocephalic and atraumatic.  Eyes:     Conjunctiva/sclera: Conjunctivae normal.     Pupils: Pupils are equal, round, and reactive to light.  Cardiovascular:     Rate and Rhythm: Normal rate and regular rhythm.     Heart sounds: Normal heart sounds.  Pulmonary:     Effort: Pulmonary effort is normal.     Breath sounds: Normal breath sounds.  Abdominal:     General: Bowel sounds are normal.     Palpations: Abdomen is soft.  Musculoskeletal:     Cervical back: Normal range of motion and neck supple.  Skin:    General: Skin is warm and dry.     Capillary Refill:  Capillary refill takes less than 2 seconds.  Neurological:     Mental Status: He is alert and oriented to person, place, and time.  Psychiatric:        Behavior: Behavior normal.      Musculoskeletal Exam: C-spine thoracic and lumbar spine were in good range of motion.  He had no SI joint tenderness.  Shoulder joints, elbow joints, wrist joints, MCPs PIPs and DIPs with good range of motion.  He has some DIP thickening.  Hip joints, knee joints, ankles with good range of motion with no synovitis.  There was no evidence of Achilles tendinitis or plantar fasciitis.  CDAI Exam: CDAI Score: -- Patient Global: --; Provider Global: -- Swollen: --; Tender: -- Joint Exam 03/06/2020   No joint exam has been documented for this visit   There is currently no information documented on the homunculus. Go to the Rheumatology activity and complete the homunculus joint exam.  Investigation: No additional findings.  Imaging: No results found.  Recent Labs: Lab Results  Component Value Date   WBC 5.8 09/12/2019   HGB 15.3 09/12/2019   PLT 256 09/12/2019   NA 139 09/12/2019   K 4.2 09/12/2019   CL 104 09/12/2019   CO2 25 09/12/2019   GLUCOSE 108 (H) 09/12/2019   BUN 15 09/12/2019   CREATININE 0.82 09/12/2019   BILITOT 0.5 09/12/2019   ALKPHOS 64 07/16/2018   AST 19 09/12/2019   ALT 18 09/12/2019   PROT 6.9 09/12/2019   ALBUMIN 4.3 07/16/2018   CALCIUM 9.3 09/12/2019   GFRAA 121 09/12/2019   QFTBGOLDPLUS NEGATIVE 12/13/2018   02/21/2000 CBC and CMP were normal, TB Gold was negative.  Speciality Comments: No specialty comments available.  Procedures:  No procedures performed Allergies: Patient has no known allergies.   Assessment / Plan:     Visit Diagnoses: Ankylosing spondylitis of multiple sites in spine (HCC)-he is doing well.  He denies any joint pain or joint swelling.  He had good mobility in his spine.  He had no problems with plantar fasciitis or Achilles tendinitis  lately.  High risk medication use - Taltz 80 mg sq injections every 28 days.  His labs have been stable.  His next labs will be in October and then every 3 months to monitor for drug toxicity.  Patient is having issues with insurance regarding Taltz refill.  Sample for Altamease Oiler was given.  Sacroiliitis (HCC)-he had no tenderness over SI joints.  DDD (degenerative disc disease), cervical-he had good range of motion without discomfort.  DDD (degenerative disc disease), thoracic-doing very well.  DDD (degenerative disc disease), lumbar-he had no discomfort with range of motion.  Subacromial bursitis of left shoulder joint-improved.  Myalgia  Neuropathy-he is followed by neurologist.  Other fatigue  History of gastroesophageal reflux (GERD)  Orders: No orders of the defined types were placed in this encounter.  No orders of the defined types were placed in this encounter.  Follow-Up Instructions: Return in about 5 months (around 08/06/2020) for Ankylosing spondylitis.   Pollyann Savoy, MD  Note - This record has been created using [employer].  Chart creation errors have been sought, but may not always  have been located. Such creation errors do not reflect on  the standard of medical care.

## 2020-02-23 ENCOUNTER — Encounter: Payer: Self-pay | Admitting: Rheumatology

## 2020-02-29 ENCOUNTER — Telehealth: Payer: Self-pay | Admitting: *Deleted

## 2020-02-29 NOTE — Telephone Encounter (Signed)
Medication Samples have been provided to the patient.  Drug name: Altamease Oiler       Strength: 80 mg       Qty: 1  LOT: [id number]  Exp.Date: 03/2021  Dosing instructions: Inject 80 mg (one pen) into skin every 28 days.   The patient has been instructed regarding the correct time, dose, and frequency of taking this medication, including desired effects and most common side effects.   Dulcy Fanny 4:30 PM 02/29/2020

## 2020-02-29 NOTE — Telephone Encounter (Signed)
Labs received from [name], NP Drawn on 02/21/2020 Reviewed by Sherron Ales, PA-C  TB Gold Negative CBC/CMP WNL

## 2020-03-06 ENCOUNTER — Other Ambulatory Visit: Payer: Self-pay

## 2020-03-06 ENCOUNTER — Encounter: Payer: Self-pay | Admitting: Rheumatology

## 2020-03-06 ENCOUNTER — Telehealth: Payer: Self-pay | Admitting: Pharmacy Technician

## 2020-03-06 ENCOUNTER — Ambulatory Visit (INDEPENDENT_AMBULATORY_CARE_PROVIDER_SITE_OTHER): Payer: BC Managed Care – PPO | Admitting: Rheumatology

## 2020-03-06 ENCOUNTER — Telehealth: Payer: Self-pay

## 2020-03-06 VITALS — BP 128/79 | HR 74 | Ht 72.0 in | Wt 194.8 lb

## 2020-03-06 DIAGNOSIS — M461 Sacroiliitis, not elsewhere classified: Secondary | ICD-10-CM

## 2020-03-06 DIAGNOSIS — M7552 Bursitis of left shoulder: Secondary | ICD-10-CM

## 2020-03-06 DIAGNOSIS — M450 Ankylosing spondylitis of multiple sites in spine: Secondary | ICD-10-CM

## 2020-03-06 DIAGNOSIS — M5136 Other intervertebral disc degeneration, lumbar region: Secondary | ICD-10-CM

## 2020-03-06 DIAGNOSIS — G629 Polyneuropathy, unspecified: Secondary | ICD-10-CM

## 2020-03-06 DIAGNOSIS — M5030 Other cervical disc degeneration, unspecified cervical region: Secondary | ICD-10-CM

## 2020-03-06 DIAGNOSIS — M791 Myalgia, unspecified site: Secondary | ICD-10-CM

## 2020-03-06 DIAGNOSIS — Z79899 Other long term (current) drug therapy: Secondary | ICD-10-CM | POA: Diagnosis not present

## 2020-03-06 DIAGNOSIS — M5134 Other intervertebral disc degeneration, thoracic region: Secondary | ICD-10-CM

## 2020-03-06 DIAGNOSIS — Z8719 Personal history of other diseases of the digestive system: Secondary | ICD-10-CM

## 2020-03-06 DIAGNOSIS — R5383 Other fatigue: Secondary | ICD-10-CM

## 2020-03-06 NOTE — Telephone Encounter (Signed)
Due to circumstances with insurance, patient will receive taltz samples from our office until the end of the year, per Dr. Corliss Skains. Patient was provided with a sample today (which will be September 2021 dose). He will contact the office after next dose to obtain the following months. Patient will pick up samples on a month to month basis.

## 2020-03-06 NOTE — Patient Instructions (Addendum)
COVID-19 vaccine recommendations:   COVID-19 vaccine is recommended for everyone (unless you are allergic to a vaccine component), even if you are on a medication that suppresses your immune system.   If you are on Methotrexate, Cellcept (mycophenolate), Rinvoq, Xeljanz, and Olumiant- hold the medication for 1 week after each vaccine. Hold Methotrexate for 2 weeks after the single dose COVID-19 vaccine.   If you are on Orencia subcutaneous injection - hold medication one week prior to and one week after the first COVID-19 vaccine dose (only).   If you are on Orencia IV infusions- time vaccination administration so that the first COVID-19 vaccination will occur four weeks after the infusion and postpone the subsequent infusion by one week.   If you are on Cyclophosphamide or Rituxan infusions please contact your doctor prior to receiving the COVID-19 vaccine.   Do not take Tylenol or ant anti-inflammatory medications (NSAIDs) 24 hours prior to the COVID-19 vaccination.   There is no direct evidence about the efficacy of the COVID-19 vaccine in individuals who are on medications that suppress the immune system.   Even if you are fully vaccinated, and you are on any medications that suppress your immune system, please continue to wear a mask, maintain at least six feet social distance and practice hand hygiene.   If you develop a COVID-19 infection, please contact your PCP or our office to determine if you need antibody infusion.  We anticipate that a booster vaccine will be available soon for immunosuppressed individuals. Please cal our office before receiving your booster dose to make adjustments to your medication regimen.    Standing Labs We placed an order today for your standing lab work.   Please have your standing labs drawn in October and every 3 months   If possible, please have your labs drawn 2 weeks prior to your appointment so that the provider can discuss your results at your  appointment.  We have open lab daily Monday through Thursday from 8:30-12:30 PM and 1:30-4:30 PM and Friday from 8:30-12:30 PM and 1:30-4:00 PM at the office of Dr. Galadriel Shroff, [facility].   Please be advised, patients with office appointments requiring lab work will take precedents over walk-in lab work.  If possible, please come for your lab work on Monday and Friday afternoons, as you may experience shorter wait times. The office is located at [address], [address], [place], [place] [address] No appointment is necessary.   Labs are drawn by [facility]. Please bring your co-pay at the time of your lab draw.  You may receive a bill from [facility] for your lab work.  If you wish to have your labs drawn at another location, please call the office 24 hours in advance to send orders.  If you have any questions regarding directions or hours of operation,  please call [phone].   As a reminder, please drink plenty of water prior to coming for your lab work. Thanks!   

## 2020-03-06 NOTE — Telephone Encounter (Signed)
Called Taltz together and was advised that patient has maxed out his copay card benefits for the year. Next option is for patient to pay out of pocket and submit for reimbursements, until he becomes eligible for new copay card funds in January.  Sent message to out Taltz rep to see if there are any other options for this patient.   Phone# [phone]

## 2020-03-06 NOTE — Telephone Encounter (Signed)
Noted  

## 2020-03-06 NOTE — Progress Notes (Signed)
Medication Samples have been provided to the patient.  Drug name: Altamease Oiler    Strength: 80mg       Qty: 1  LOT AA  Exp.Date: 03/2021  Dosing instructions: Inject 80mg  into the skin every 28 days.   The patient has been instructed regarding the correct time, dose, and frequency of taking this medication, including desired effects and most common side effects.   Laquia Rosano C Yahayra Geis 4:05 PM 03/06/2020

## 2020-03-07 ENCOUNTER — Telehealth: Payer: Self-pay | Admitting: Rheumatology

## 2020-03-07 NOTE — Telephone Encounter (Signed)
Rinaldo Cloud from [employer] called stating there may still be money available.  In order for [place] to give additional help, we will need the enrollment form faxed to [phone].  If you have any questions, please call back at #[phone]

## 2020-03-08 NOTE — Telephone Encounter (Signed)
Called patient, had to leave message. Will mail Taltz enrollment form to get patient's signatures. Will send provider portion to office for MD signatures.

## 2020-03-13 NOTE — Telephone Encounter (Signed)
Provider portion signed and faxed to [employer].  Will update when we receive a response.  Fax # [phone]  Verlin Fester, PharmD, BCACP, CPP Clinical Specialty Pharmacist (Rheumatology and Pulmonology)  03/13/2020 8:44 AM

## 2020-03-15 NOTE — Telephone Encounter (Signed)
Spoke to [employer], who advised the same information that was previously received. Patient's only option is to submit for reimbursements.

## 2020-04-11 ENCOUNTER — Telehealth: Payer: Self-pay | Admitting: Rheumatology

## 2020-04-11 NOTE — Telephone Encounter (Signed)
Lillia Abed, Technical brewer for Altamease Oiler called stating she is filling in for [place].  Lillia Abed states they received the patient section with copies of insurance, but are missing the provider section of the enrollment form.  Lillia Abed is requesting a return call to let her know if they need to do a benefit investigation or if the medication is being sent to another specialty pharmacy.  Please call back at #[phone]

## 2020-04-11 NOTE — Telephone Encounter (Signed)
Returned call and advised that office had spoken to Sheralyn Boatman, who advised that reimbursement option was patient's only available option. Advised her no new benefits investigation is needed unless there is another option available for patient. Left her my call back number.

## 2020-04-29 ENCOUNTER — Encounter: Payer: Self-pay | Admitting: Rheumatology

## 2020-04-30 NOTE — Telephone Encounter (Signed)
Patient's copay assistance has been exhausted for the year, he will need to coordinate samples with office. MD approval is in previous encounter.

## 2020-05-01 NOTE — Telephone Encounter (Signed)
Medication Samples have been provided to the patient.  Drug name: Altamease Oiler    Strength: 80 mg        Qty: 1 LOT: [id number] Exp.Date: 05/2020  Dosing instructions: Inject 80 mg (1 pen) into skin every 28 days.   The patient has been instructed regarding the correct time, dose, and frequency of taking this medication, including desired effects and most common side effects.   Jon Henderson 3:53 PM 05/01/2020

## 2020-05-29 ENCOUNTER — Telehealth: Payer: Self-pay | Admitting: *Deleted

## 2020-05-29 ENCOUNTER — Other Ambulatory Visit: Payer: Self-pay | Admitting: Physician Assistant

## 2020-05-29 DIAGNOSIS — Z79899 Other long term (current) drug therapy: Secondary | ICD-10-CM

## 2020-05-29 NOTE — Telephone Encounter (Signed)
Medication Samples have been provided to the patient.  Drug name: Altamease Oiler      Strength: 80 mg        Qty: 1 LOT: [id number]  Exp.Date: 12/2021  Dosing instructions: Inject 80 mg into skin every 28 days.   The patient has been instructed regarding the correct time, dose, and frequency of taking this medication, including desired effects and most common side effects.   Dulcy Fanny 3:58 PM 05/29/2020

## 2020-05-31 LAB — QUANTIFERON-TB GOLD PLUS
Mitogen-NIL: >10 IU/mL
NIL: 0.02 IU/mL
QuantiFERON-TB Gold Plus: NEGATIVE
TB1-NIL: 0.01 IU/mL
TB2-NIL: 0.01 IU/mL

## 2020-06-01 NOTE — Progress Notes (Signed)
TB gold is negative.

## 2020-07-02 ENCOUNTER — Encounter: Payer: Self-pay | Admitting: Rheumatology

## 2020-07-04 ENCOUNTER — Other Ambulatory Visit: Payer: Self-pay | Admitting: *Deleted

## 2020-07-04 DIAGNOSIS — Z79899 Other long term (current) drug therapy: Secondary | ICD-10-CM

## 2020-07-04 LAB — CBC WITH DIFFERENTIAL/PLATELET
Absolute Monocytes: 878 cells/uL (ref 200–950)
Basophils Absolute: 58 cells/uL (ref 0–200)
Basophils Relative: 0.8 %
Eosinophils Absolute: 79 cells/uL (ref 15–500)
Eosinophils Relative: 1.1 %
HCT: 46.3 % (ref 38.5–50.0)
Hemoglobin: 16 g/dL (ref 13.2–17.1)
Lymphs Abs: 1462 cells/uL (ref 850–3900)
MCH: 33 pg (ref 27.0–33.0)
MCHC: 34.6 g/dL (ref 32.0–36.0)
MCV: 95.5 fL (ref 80.0–100.0)
MPV: 9.9 fL (ref 7.5–12.5)
Monocytes Relative: 12.2 %
Neutro Abs: 4723 cells/uL (ref 1500–7800)
Neutrophils Relative %: 65.6 %
Platelets: 262 10*3/uL (ref 140–400)
RBC: 4.85 10*6/uL (ref 4.20–5.80)
RDW: 12.7 % (ref 11.0–15.0)
Total Lymphocyte: 20.3 %
WBC: 7.2 10*3/uL (ref 3.8–10.8)

## 2020-07-04 LAB — COMPLETE METABOLIC PANEL WITH GFR
AG Ratio: 1.8 (calc) (ref 1.0–2.5)
ALT: 24 U/L (ref 9–46)
AST: 26 U/L (ref 10–40)
Albumin: 4.5 g/dL (ref 3.6–5.1)
Alkaline phosphatase (APISO): 54 U/L (ref 36–130)
BUN: 16 mg/dL (ref 7–25)
CO2: 27 mmol/L (ref 20–32)
Calcium: 9.3 mg/dL (ref 8.6–10.3)
Chloride: 102 mmol/L (ref 98–110)
Creat: 0.87 mg/dL (ref 0.60–1.35)
GFR, Est African American: 117 mL/min/{1.73_m2} (ref >=60)
GFR, Est Non African American: 101 mL/min/{1.73_m2} (ref >=60)
Globulin: 2.5 g/dL (calc) (ref 1.9–3.7)
Glucose, Bld: 89 mg/dL (ref 65–99)
Potassium: 4.2 mmol/L (ref 3.5–5.3)
Sodium: 138 mmol/L (ref 135–146)
Total Bilirubin: 0.5 mg/dL (ref 0.2–1.2)
Total Protein: 7 g/dL (ref 6.1–8.1)

## 2020-07-05 NOTE — Progress Notes (Signed)
CBC and CMP are normal.

## 2020-07-13 NOTE — Telephone Encounter (Signed)
Medication Samples have been provided to the patient.  Drug name: Altamease Oiler       Strength: 80 mg       Qty: 1 LOT: [id number]  Exp.Date: 11/2021  Dosing instructions: Inject 80 mg into skin every 28 days.   The patient has been instructed regarding the correct time, dose, and frequency of taking this medication, including desired effects and most common side effects.   Dulcy Fanny 4:57 PM 07/13/2020

## 2020-07-26 NOTE — Progress Notes (Addendum)
Office Visit Note  Patient: Jon Henderson             Date of Birth: 01-15-1971           MRN: [id number]             PCP: [name], NP Referring: [name], NP Visit Date: 08/09/2020 Occupation: @GUAROCC @  Subjective:  Medication management   History of Present Illness: Jon Henderson is a 49 y.o. male with history of ankylosing spondylitis.  He states that Altamease Oiler has been working quite well for him although he starts having some joint pain and stiffness right before his next shot.  He states the pain is mostly in his lower back and his hands.  He also states that since he had neuropathy and weakness in his upper extremities he feels more discomfort in his joints.  He would like to go for physical therapy for muscle strengthening.  He denies any history of eye inflammation, Achilles tendinitis or plantar fasciitis.  Activities of Daily Living:  Patient reports morning stiffness for 30-45 minutes.   Patient Reports nocturnal pain.  Difficulty dressing/grooming: Denies Difficulty climbing stairs: Denies Difficulty getting out of chair: Denies Difficulty using hands for taps, buttons, cutlery, and/or writing: Reports  Review of Systems  Constitutional: Positive for fatigue.  HENT: Negative for mouth sores, mouth dryness and nose dryness.   Eyes: Negative for pain, itching, visual disturbance and dryness.  Respiratory: Positive for cough. Negative for hemoptysis, shortness of breath and difficulty breathing.        Related to GER  Cardiovascular: Negative for chest pain, palpitations and swelling in legs/feet.  Gastrointestinal: Positive for heartburn. Negative for abdominal pain, blood in stool, constipation and diarrhea.  Endocrine: Negative for increased urination.  Genitourinary: Negative for painful urination.  Musculoskeletal: Positive for arthralgias, joint pain, joint swelling and morning stiffness. Negative for myalgias, muscle weakness, muscle  tenderness and myalgias.  Skin: Negative for color change, rash and redness.  Allergic/Immunologic: Negative for susceptible to infections.  Neurological: Positive for weakness. Negative for dizziness, numbness, headaches and memory loss.  Hematological: Negative for swollen glands.  Psychiatric/Behavioral: Positive for depressed mood and sleep disturbance. Negative for confusion.    PMFS History:  Patient Active Problem List   Diagnosis Date Noted  . Ankylosing spondylitis (HCC) 06/12/2016  . Sacroiliitis (HCC) 06/12/2016  . High risk medication use 06/12/2016  . ABNORMAL HEART RHYTHMS 04/20/2008  . DDD (degenerative disc disease), lumbar 04/20/2008  . COUGH 04/20/2008    Past Medical History:  Diagnosis Date  . Ankylosing spondylitis (HCC)   . Disc disorder of lumbar region     Family History  Problem Relation Age of Onset  . Ankylosing spondylitis Mother   . COPD Father   . Drug abuse Sister   . Alcohol abuse Sister   . Stroke Maternal Grandfather   . Healthy Daughter   . ADD / ADHD Daughter   . Healthy Daughter   . ADD / ADHD Daughter   . Healthy Daughter    Past Surgical History:  Procedure Laterality Date  . LUMBAR DISC SURGERY     Social History   Social History Narrative  . Not on file   Immunization History  Administered Date(s) Administered  . Influenza-Unspecified 03/23/2018  . PFIZER SARS-COV-2 Vaccination 08/29/2019, 09/19/2019     Objective: Vital Signs: BP (!) 150/96 (BP Location: Left Arm, Patient Position: Sitting, Cuff Size: Normal)   Pulse 88   Ht 6' (1.829 m)  Wt 193 lb (87.5 kg)   BMI 26.18 kg/m    Physical Exam Vitals and nursing note reviewed.  Constitutional:      Appearance: He is well-developed and well-nourished.  HENT:     Head: Normocephalic and atraumatic.  Eyes:     Extraocular Movements: EOM normal.     Conjunctiva/sclera: Conjunctivae normal.     Pupils: Pupils are equal, round, and reactive to light.   Cardiovascular:     Rate and Rhythm: Normal rate and regular rhythm.     Heart sounds: Normal heart sounds.  Pulmonary:     Effort: Pulmonary effort is normal.     Breath sounds: Normal breath sounds.  Abdominal:     General: Bowel sounds are normal.     Palpations: Abdomen is soft.  Musculoskeletal:     Cervical back: Normal range of motion and neck supple.  Skin:    General: Skin is warm and dry.     Capillary Refill: Capillary refill takes less than 2 seconds.  Neurological:     Mental Status: He is alert and oriented to person, place, and time.  Psychiatric:        Mood and Affect: Mood and affect normal.        Behavior: Behavior normal.      Musculoskeletal Exam: C-spine thoracic and lumbar spine were in good range of motion.  He had no SI joint tenderness.  Shoulder joints, elbow joints, wrist joints with good range of motion.  He has no DIP or PIP thickening.  He had good range of motion of his hip joints, knee joints, ankles.  There was no evidence of Achilles tendinitis or plantar fasciitis.  CDAI Exam: CDAI Score: -- Patient Global: --; Provider Global: -- Swollen: --; Tender: -- Joint Exam 08/09/2020   No joint exam has been documented for this visit   There is currently no information documented on the homunculus. Go to the Rheumatology activity and complete the homunculus joint exam.  Investigation: No additional findings.  Imaging: No results found.  Recent Labs: Lab Results  Component Value Date   WBC 7.2 07/04/2020   HGB 16.0 07/04/2020   PLT 262 07/04/2020   NA 138 07/04/2020   K 4.2 07/04/2020   CL 102 07/04/2020   CO2 27 07/04/2020   GLUCOSE 89 07/04/2020   BUN 16 07/04/2020   CREATININE 0.87 07/04/2020   BILITOT 0.5 07/04/2020   ALKPHOS 64 07/16/2018   AST 26 07/04/2020   ALT 24 07/04/2020   PROT 7.0 07/04/2020   ALBUMIN 4.3 07/16/2018   CALCIUM 9.3 07/04/2020   GFRAA 117 07/04/2020   QFTBGOLDPLUS NEGATIVE 05/29/2020    Speciality  Comments: No specialty comments available.  Procedures:  No procedures performed Allergies: Patient has no known allergies.   Assessment / Plan:     Visit Diagnoses: Ankylosing spondylitis of multiple sites in spine (HCC)-patient states he has mild flare prior to the next Taltz injection.  He had no synovitis on examination.  He had no SI joint tenderness.  He had good mobility in his thoracic and lumbar spine without any point tenderness.  He is doing much better on Taltz.  Patient has tried several NSAIDs since 1994 and had failed most of them.  He was initially on naproxen then Lodine followed by Meloxicam and Celebrex.  He had an inadequate response to all NSAIDs.  He had an inadequate response to Humira, Enbrel, and Cosentyx.  High risk medication use - Taltz 80 mg  sq injections every 28 days.  His labs have been stable.  We will get labs in March and then every 3 months to monitor for drug toxicity.  Last TB Gold was May 29, 2020.  Sacroiliitis (HCC)-he had no SI joint tenderness on examination.  DDD (degenerative disc disease), cervical-he had good range of motion.  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  DDD (degenerative disc disease), lumbar-chronic pain.  Subacromial bursitis of left shoulder joint-is off-and-on discomfort.  Neuropathy - he is followed by neurologist.  He has developed muscle weakness in his bilateral upper extremities.  He has lost muscle mass.  Per his request I will refer him to physical therapy for upper extremity muscle strengthening.  History of gastroesophageal reflux (GERD)-continues to have some reflux issues and cough in the morning.  Other fatigue-he believes the fatigue is due to increased stress at work.  Elevated blood pressure reading-his blood pressure is elevated today.  He has been advised to monitor blood pressure closely.  He has been under a  lot of stress.  Educated about COVID-19 virus infection-he is fully vaccinated against COVID-19.  Use of booster was discussed.  Use of mask, hand hygiene, and social distancing was discussed.  Orders: No orders of the defined types were placed in this encounter.  No orders of the defined types were placed in this encounter.    Follow-Up Instructions: Return in about 5 months (around 01/07/2021) for Ankylosing spondylitis.   Pollyann Savoy, MD  Note - This record has been created using Animal nutritionist.  Chart creation errors have been sought, but may not always  have been located. Such creation errors do not reflect on  the standard of medical care.

## 2020-08-06 ENCOUNTER — Encounter: Payer: Self-pay | Admitting: Rheumatology

## 2020-08-06 ENCOUNTER — Telehealth: Payer: Self-pay | Admitting: Pharmacy Technician

## 2020-08-06 DIAGNOSIS — M458 Ankylosing spondylitis sacral and sacrococcygeal region: Secondary | ICD-10-CM

## 2020-08-06 NOTE — Telephone Encounter (Signed)
Submitted a Prior Authorization request to [facility] for TALTZ via Cover My Meds. Will update once we receive a response.   (Key: [id number])  Will see if patient can fill at a different SP moving forward.

## 2020-08-06 NOTE — Telephone Encounter (Signed)
Returned patient's call - he may pick up Taltz sample since we are unable to get through to pharmacy. He was due for his dose last week. Rachael and I attempted to call pharmacy today - unable to reach rep after 1 hour to schedule shipment. We have renewed his prior authorization through his new insurance and may be able to utilize [facility] or another specialty pharmacy moving forward. His insurance and member ID changed.   Will need new rx sent to insurance-preferred pharmacy.  Chesley Mires, PharmD, MPH Clinical Pharmacist (Rheumatology and Pulmonology)

## 2020-08-07 NOTE — Telephone Encounter (Signed)
Medication Samples have been provided to the patient.  Drug name: Altamease Oiler     Strength: 80 mg       Qty: 1 LOT: [id number]  Exp.Date: 11/2021  Dosing instructions: Inject 80 mg into skin every 30 days.

## 2020-08-09 ENCOUNTER — Other Ambulatory Visit: Payer: Self-pay

## 2020-08-09 ENCOUNTER — Ambulatory Visit: Payer: BC Managed Care – PPO | Admitting: Rheumatology

## 2020-08-09 ENCOUNTER — Encounter: Payer: Self-pay | Admitting: Rheumatology

## 2020-08-09 VITALS — BP 150/96 | HR 88 | Ht 72.0 in | Wt 193.0 lb

## 2020-08-09 DIAGNOSIS — G629 Polyneuropathy, unspecified: Secondary | ICD-10-CM

## 2020-08-09 DIAGNOSIS — Z79899 Other long term (current) drug therapy: Secondary | ICD-10-CM | POA: Diagnosis not present

## 2020-08-09 DIAGNOSIS — Z8719 Personal history of other diseases of the digestive system: Secondary | ICD-10-CM

## 2020-08-09 DIAGNOSIS — R5383 Other fatigue: Secondary | ICD-10-CM

## 2020-08-09 DIAGNOSIS — R030 Elevated blood-pressure reading, without diagnosis of hypertension: Secondary | ICD-10-CM

## 2020-08-09 DIAGNOSIS — M791 Myalgia, unspecified site: Secondary | ICD-10-CM

## 2020-08-09 DIAGNOSIS — M7552 Bursitis of left shoulder: Secondary | ICD-10-CM

## 2020-08-09 DIAGNOSIS — Z7189 Other specified counseling: Secondary | ICD-10-CM

## 2020-08-09 DIAGNOSIS — M5030 Other cervical disc degeneration, unspecified cervical region: Secondary | ICD-10-CM

## 2020-08-09 DIAGNOSIS — M450 Ankylosing spondylitis of multiple sites in spine: Secondary | ICD-10-CM | POA: Diagnosis not present

## 2020-08-09 DIAGNOSIS — M5134 Other intervertebral disc degeneration, thoracic region: Secondary | ICD-10-CM

## 2020-08-09 DIAGNOSIS — M461 Sacroiliitis, not elsewhere classified: Secondary | ICD-10-CM | POA: Diagnosis not present

## 2020-08-09 DIAGNOSIS — M5136 Other intervertebral disc degeneration, lumbar region: Secondary | ICD-10-CM

## 2020-08-09 NOTE — Telephone Encounter (Signed)
Received a fax regarding Prior Authorization from [facility] for TALTZ. Authorization has been DENIED because plan would like to see chart notes showing patient has tried/fails 2 NSAIDs for 4 week trials.   Faxed SRS notes to plan showing patient's Nsaid history. Will have patient sign Altamease Oiler form to get on program while insurance processes additional documents.

## 2020-08-09 NOTE — Patient Instructions (Addendum)
Standing Labs We placed an order today for your standing lab work.   Please have your standing labs drawn in March and every 3 months  If possible, please have your labs drawn 2 weeks prior to your appointment so that the provider can discuss your results at your appointment.  We have open lab daily Monday through Thursday from 8:30-12:30 PM and 1:30-4:30 PM and Friday from 8:30-12:30 PM and 1:30-4:00 PM at the office of Dr. Ronne Savoia, [facility].   Please be advised, patients with office appointments requiring lab work will take precedents over walk-in lab work.  If possible, please come for your lab work on Monday and Friday afternoons, as you may experience shorter wait times. The office is located at [address], [address], [place], [place] [address] No appointment is necessary.   Labs are drawn by [facility]. Please bring your co-pay at the time of your lab draw.  You may receive a bill from [facility] for your lab work.  If you wish to have your labs drawn at another location, please call the office 24 hours in advance to send orders.  If you have any questions regarding directions or hours of operation,  please call [phone].   As a reminder, please drink plenty of water prior to coming for your lab work. Thanks!   COVID-19 vaccine recommendations:   COVID-19 vaccine is recommended for everyone (unless you are allergic to a vaccine component), even if you are on a medication that suppresses your immune system.    Do not take Tylenol or any anti-inflammatory medications (NSAIDs) 24 hours prior to the COVID-19 vaccination.   There is no direct evidence about the efficacy of the COVID-19 vaccine in individuals who are on medications that suppress the immune system.   Even if you are fully vaccinated, and you are on any medications that suppress your immune system, please continue to wear a mask, maintain at least six feet social distance and practice hand  hygiene.   If you develop a COVID-19 infection, please contact your PCP or our office to determine if you need monoclonal antibody infusion.  The booster vaccine is now available for immunocompromised patients.   Please see the following web sites for updated information.   [email or web address]    

## 2020-08-14 NOTE — Telephone Encounter (Signed)
Re-submitted appeal with newest chart notes detailing Nsaid use.

## 2020-08-15 ENCOUNTER — Other Ambulatory Visit: Payer: Self-pay | Admitting: Rheumatology

## 2020-08-15 MED ORDER — TALTZ 80 MG/ML ~~LOC~~ SOAJ: 1.0000 mL | SUBCUTANEOUS | 2 refills | Status: DC

## 2020-08-15 NOTE — Telephone Encounter (Signed)
Notified patient that Altamease Oiler is approved through insurance. Sent MyChart message with [facility] pharmacy phone number and copay card information.  Patient verbalized understanding.  Already taking Taltz 80 mg SQ every 28 days (not a new start).  CBC/CMP due March 2022. TB gold due July 22  Chesley Mires, PharmD, MPH Clinical Pharmacist (Rheumatology and Pulmonology)

## 2020-08-15 NOTE — Telephone Encounter (Signed)
Received notification from [facility] regarding a prior authorization for TALTZ. Authorization has been APPROVED from 08/07/20 to 08/06/21.   Authorization # [id number]   Ran test claim, patient's copay for 1 month supply is $100. Patient can fill through [facility].  Taltz copay card info:  [phone] PCN- [place] ID- [id number] Group- [id number]

## 2020-08-20 MED FILL — TALTZ 80 MG/ML AUTOINJECTOR: 80 | 28 days supply | Qty: 1 | Fill #0

## 2020-08-28 DIAGNOSIS — G629 Polyneuropathy, unspecified: Secondary | ICD-10-CM | POA: Diagnosis not present

## 2020-08-28 DIAGNOSIS — M545 Low back pain, unspecified: Secondary | ICD-10-CM | POA: Diagnosis not present

## 2020-09-04 ENCOUNTER — Ambulatory Visit: Payer: BC Managed Care – PPO | Admitting: Rheumatology

## 2020-09-24 MED FILL — TALTZ 80 MG/ML AUTOINJECTOR: 80 | 28 days supply | Qty: 1 | Fill #1

## 2020-10-17 MED FILL — TALTZ 80 MG/ML AUTOINJECTOR: 80 | 28 days supply | Qty: 1 | Fill #2

## 2020-10-25 ENCOUNTER — Other Ambulatory Visit (HOSPITAL_COMMUNITY): Payer: Self-pay

## 2020-11-14 ENCOUNTER — Other Ambulatory Visit (HOSPITAL_COMMUNITY): Payer: Self-pay

## 2020-11-16 ENCOUNTER — Other Ambulatory Visit (HOSPITAL_COMMUNITY): Payer: Self-pay

## 2020-11-19 ENCOUNTER — Other Ambulatory Visit (HOSPITAL_COMMUNITY): Payer: Self-pay

## 2020-11-19 ENCOUNTER — Other Ambulatory Visit: Payer: Self-pay | Admitting: Rheumatology

## 2020-11-19 ENCOUNTER — Other Ambulatory Visit: Payer: Self-pay

## 2020-11-19 DIAGNOSIS — Z79899 Other long term (current) drug therapy: Secondary | ICD-10-CM | POA: Diagnosis not present

## 2020-11-19 DIAGNOSIS — M458 Ankylosing spondylitis sacral and sacrococcygeal region: Secondary | ICD-10-CM

## 2020-11-19 MED ORDER — TALTZ 80 MG/ML ~~LOC~~ SOAJ
SUBCUTANEOUS | 2 refills | Status: DC
  Filled 2020-11-19: qty 1, 28d supply, fill #0
  Filled 2020-12-14 – 2020-12-17 (×6): qty 1, 28d supply, fill #1

## 2020-11-19 NOTE — Telephone Encounter (Signed)
Next Visit: 01/07/2021  Last Visit: 08/09/2020  Last Fill: 08/15/2020  DX: Ankylosing spondylitis of multiple sites in spine   Current Dose per office note 08/09/2020, Taltz 80 mg sq injections every 28 days  Labs: 07/24/2020, CBC and CMP are normal.  TB Gold: 05/29/2020, negative  Okay to refill Taltz?

## 2020-11-20 LAB — COMPLETE METABOLIC PANEL WITH GFR
AG Ratio: 1.7 (calc) (ref 1.0–2.5)
ALT: 42 U/L (ref 9–46)
AST: 41 U/L — ABNORMAL HIGH (ref 10–40)
Albumin: 3.8 g/dL (ref 3.6–5.1)
Alkaline phosphatase (APISO): 46 U/L (ref 36–130)
BUN: 14 mg/dL (ref 7–25)
CO2: 28 mmol/L (ref 20–32)
Calcium: 8.5 mg/dL — ABNORMAL LOW (ref 8.6–10.3)
Chloride: 102 mmol/L (ref 98–110)
Creat: 1.07 mg/dL (ref 0.60–1.35)
GFR, Est African American: 94 mL/min/{1.73_m2} (ref >=60)
GFR, Est Non African American: 81 mL/min/{1.73_m2} (ref >=60)
Globulin: 2.2 g/dL (calc) (ref 1.9–3.7)
Glucose, Bld: 97 mg/dL (ref 65–99)
Potassium: 3.9 mmol/L (ref 3.5–5.3)
Sodium: 139 mmol/L (ref 135–146)
Total Bilirubin: 0.3 mg/dL (ref 0.2–1.2)
Total Protein: 6 g/dL — ABNORMAL LOW (ref 6.1–8.1)

## 2020-11-20 LAB — CBC WITH DIFFERENTIAL/PLATELET
Absolute Monocytes: 1062 cells/uL — ABNORMAL HIGH (ref 200–950)
Basophils Absolute: 38 cells/uL (ref 0–200)
Basophils Relative: 0.4 %
Eosinophils Absolute: 38 cells/uL (ref 15–500)
Eosinophils Relative: 0.4 %
HCT: 40.4 % (ref 38.5–50.0)
Hemoglobin: 13.8 g/dL (ref 13.2–17.1)
Lymphs Abs: 2040 cells/uL (ref 850–3900)
MCH: 32.7 pg (ref 27.0–33.0)
MCHC: 34.2 g/dL (ref 32.0–36.0)
MCV: 95.7 fL (ref 80.0–100.0)
MPV: 9.7 fL (ref 7.5–12.5)
Monocytes Relative: 11.3 %
Neutro Abs: 6223 cells/uL (ref 1500–7800)
Neutrophils Relative %: 66.2 %
Platelets: 227 10*3/uL (ref 140–400)
RBC: 4.22 10*6/uL (ref 4.20–5.80)
RDW: 12.3 % (ref 11.0–15.0)
Total Lymphocyte: 21.7 %
WBC: 9.4 10*3/uL (ref 3.8–10.8)

## 2020-11-20 NOTE — Progress Notes (Signed)
CBC is normal.  Liver function is mildly elevated.  Calcium is mildly decreased.  Advise calcium rich diet and to avoid NSAIDs and alcohol intake.

## 2020-11-21 ENCOUNTER — Other Ambulatory Visit (HOSPITAL_COMMUNITY): Payer: Self-pay

## 2020-12-14 ENCOUNTER — Telehealth: Payer: Self-pay | Admitting: Pharmacist

## 2020-12-14 ENCOUNTER — Other Ambulatory Visit (HOSPITAL_COMMUNITY): Payer: Self-pay

## 2020-12-14 DIAGNOSIS — M458 Ankylosing spondylitis sacral and sacrococcygeal region: Secondary | ICD-10-CM

## 2020-12-14 NOTE — Telephone Encounter (Signed)
Received notification from [facility] that patient must fill Runner, broadcasting/film/video at [facility]. Seems patient has new insurance.  Submitted a Prior Authorization request to [facility] for TALTZ via CoverMyMeds. Will update once we receive a response.  CMM Key: [id number]  Chesley Mires, PharmD, MPH Clinical Pharmacist (Rheumatology and Pulmonology)

## 2020-12-14 NOTE — Telephone Encounter (Signed)
Received notification from [facility] regarding a prior authorization for TALTZ. Authorization has been APPROVED through 12/14/21.   Authorization # [id number]  Tried to re-adjudicate rx at [facility] but prior auth rejection still active. Will attempt to re-adjudicate later today  Chesley Mires, PharmD, MPH Clinical Pharmacist (Rheumatology and Pulmonology)

## 2020-12-17 ENCOUNTER — Other Ambulatory Visit (HOSPITAL_COMMUNITY): Payer: Self-pay

## 2020-12-17 MED ORDER — TALTZ 80 MG/ML ~~LOC~~ SOAJ
80.0000 mg | SUBCUTANEOUS | 1 refills | Status: DC
Start: 2020-12-17 — End: 2021-02-27

## 2020-12-17 NOTE — Telephone Encounter (Signed)
Altamease Oiler rx sent to [facility] with copay card information.  Will route back to pharmacy pool for f/u  Chesley Mires, PharmD, MPH Clinical Pharmacist (Rheumatology and Pulmonology)

## 2020-12-18 ENCOUNTER — Other Ambulatory Visit (HOSPITAL_COMMUNITY): Payer: Self-pay

## 2020-12-18 NOTE — Telephone Encounter (Signed)
Called [facility] regarding Taltz prescription. They were able to process through his insurance and provided copay card information over the phone again for copay of $5.  Called patient and provided [facility] phone number: [phone] He has set up account with pharmacy and I advised him to reach out to schedule shipment.  Chesley Mires, PharmD, MPH Clinical Pharmacist (Rheumatology and Pulmonology)

## 2020-12-25 NOTE — Progress Notes (Signed)
Office Visit Note  Patient: Jon Henderson             Date of Birth: Dec 18, 1970           MRN: [id number]             PCP: [name], NP Referring: [name], NP Visit Date: 01/07/2021 Occupation: @GUAROCC @  Subjective:  Pain in both hands   History of Present Illness: Jon Henderson is a 50 y.o. male with history of ankylosing spondylitis and DDD.  He is on taltz 80 mg sq injections every month.  He has not missed any doses of Toltz recently.  Patient reports that he has been experiencing increased pain and intermittent inflammation in both hands over the past several months.  The discomfort in his hands is exacerbated by driving.  He has been under a tremendous amount of stress with work and has been driving about 54 miles weekly which she feels is contributing to his symptoms.  He continues to have stiffness in both shoulder joints.  He denies any SI joint discomfort at this time.  He denies any Achilles tendinitis or plantar fasciitis.  He has not been taking any over-the-counter products for pain relief.  He takes tumeric and glucosamine on occasion.     Activities of Daily Living:  Patient reports morning stiffness for 30-60 minutes.   Patient Denies nocturnal pain.  Difficulty dressing/grooming: Denies Difficulty climbing stairs: Denies Difficulty getting out of chair: Denies Difficulty using hands for taps, buttons, cutlery, and/or writing: Reports  Review of Systems  Constitutional:  Positive for fatigue.  HENT:  Negative for mouth sores, mouth dryness and nose dryness.   Eyes:  Negative for pain, itching and dryness.  Respiratory:  Negative for shortness of breath and difficulty breathing.   Cardiovascular:  Negative for chest pain and palpitations.  Gastrointestinal:  Negative for blood in stool, constipation and diarrhea.  Endocrine: Negative for increased urination.  Genitourinary:  Negative for difficulty urinating.  Musculoskeletal:  Positive  for joint pain, joint pain, joint swelling, myalgias, morning stiffness and myalgias. Negative for muscle tenderness.  Skin:  Negative for color change, rash and redness.  Allergic/Immunologic: Negative for susceptible to infections.  Neurological:  Positive for numbness and weakness. Negative for dizziness, headaches and memory loss.  Hematological:  Negative for bruising/bleeding tendency.  Psychiatric/Behavioral:  Negative for confusion.    PMFS History:  Patient Active Problem List   Diagnosis Date Noted   Ankylosing spondylitis (HCC) 06/12/2016   Sacroiliitis (HCC) 06/12/2016   High risk medication use 06/12/2016   ABNORMAL HEART RHYTHMS 04/20/2008   DDD (degenerative disc disease), lumbar 04/20/2008   COUGH 04/20/2008    Past Medical History:  Diagnosis Date   Ankylosing spondylitis (HCC)    Disc disorder of lumbar region     Family History  Problem Relation Age of Onset   Ankylosing spondylitis Mother    COPD Father    Drug abuse Sister    Alcohol abuse Sister    Stroke Maternal Grandfather    Healthy Daughter    ADD / ADHD Daughter    Healthy Daughter    ADD / ADHD Daughter    Healthy Daughter    Past Surgical History:  Procedure Laterality Date   LUMBAR DISC SURGERY     Social History   Social History Narrative   Not on file   Immunization History  Administered Date(s) Administered   Influenza-Unspecified 03/23/2018   PFIZER(Purple Top)SARS-COV-2 Vaccination 08/29/2019, 09/19/2019  Objective: Vital Signs: BP (!) 144/84 (BP Location: Right Arm, Patient Position: Sitting, Cuff Size: Normal)   Pulse 80   Resp 16   Ht 6' (1.829 m)   Wt 193 lb (87.5 kg)   BMI 26.18 kg/m    Physical Exam Vitals and nursing note reviewed.  Constitutional:      Appearance: He is well-developed.  HENT:     Head: Normocephalic and atraumatic.  Eyes:     Conjunctiva/sclera: Conjunctivae normal.     Pupils: Pupils are equal, round, and reactive to light.  Pulmonary:      Effort: Pulmonary effort is normal.  Abdominal:     Palpations: Abdomen is soft.  Musculoskeletal:     Cervical back: Normal range of motion and neck supple.  Skin:    General: Skin is warm and dry.     Capillary Refill: Capillary refill takes less than 2 seconds.  Neurological:     Mental Status: He is alert and oriented to person, place, and time.  Psychiatric:        Behavior: Behavior normal.     Musculoskeletal Exam: C-spine, thoracic spine, and lumbar spine good ROM.  Shoulder joints, elbow joints, wrist joints, MCPs, PIPs, and DIPs good ROM with no synovitis.  Complete fist formation bilaterally.  Hip joints, knee joints, and ankle joints good ROM with no discomfort.  No warmth or effusion of knee joints.  No tenderness or swelling of ankle joints.   CDAI Exam: CDAI Score: -- Patient Global: --; Provider Global: -- Swollen: --; Tender: -- Joint Exam 01/07/2021   No joint exam has been documented for this visit   There is currently no information documented on the homunculus. Go to the Rheumatology activity and complete the homunculus joint exam.  Investigation: No additional findings.  Imaging: No results found.  Recent Labs: Lab Results  Component Value Date   WBC 9.4 11/19/2020   HGB 13.8 11/19/2020   PLT 227 11/19/2020   NA 139 11/19/2020   K 3.9 11/19/2020   CL 102 11/19/2020   CO2 28 11/19/2020   GLUCOSE 97 11/19/2020   BUN 14 11/19/2020   CREATININE 1.07 11/19/2020   BILITOT 0.3 11/19/2020   ALKPHOS 64 07/16/2018   AST 41 (H) 11/19/2020   ALT 42 11/19/2020   PROT 6.0 (L) 11/19/2020   ALBUMIN 4.3 07/16/2018   CALCIUM 8.5 (L) 11/19/2020   GFRAA 94 11/19/2020   QFTBGOLDPLUS NEGATIVE 05/29/2020    Speciality Comments: No specialty comments available.  Procedures:  No procedures performed Allergies: Patient has no known allergies.   Assessment / Plan:     Visit Diagnoses: Ankylosing spondylitis of multiple sites in spine [facility]): He has been  experiencing increased pain and intermittent inflammation in both hands over the past several months.   He has no synovitis or dactylitis on examination today.  He declined x-rays of both hands today.  He has not been taking any NSAIDs or tylenol for pain relief.  He remains on Taltz 80 mg sq injections every 28 days.  He has not missed any doses recently.  Overall Altamease Oiler has been effective at managing his generalized stiffness and joint pain.  He has good mobility of the C-spine, thoracic spine, and lumbar spine.  Hip joints have good range of motion on examination today with no discomfort.  No SI joint discomfort currently.  He has no evidence of Achilles tendinitis or plantar fasciitis.  He does not want to make any medication changes at this  time.  He was encouraged to take natural anti-inflammatories including tart cherry, ginger, omega 3, and turmeric on a daily basis. He will follow up in 3 months and updated x-rays will be obtained at that time.   High risk medication use - Taltz 80 mg sq injections every 28 days.  Previously had inadequate response to Cosentyx, Humira, and Enbrel. CBC and CMP drawn on 11/19/20.  He will be due to update lab work in July and every 3 months.  Standing orders for CBC and CMP are in place. TB gold negative on 05/29/20.   He has not had any recent infections.  Discussed the importance of holding Taltz if he develops signs or symptoms of infection and to resume once infection has completely cleared.  Sacroiliitis [facility]): He has not experienced any SI joint discomfort at this time.  DDD (degenerative disc disease), cervical: He has good ROM with no discomfort at this time.   DDD (degenerative disc disease), thoracic: His thoracic pain has improved.   DDD (degenerative disc disease), lumbar: His lower back pain has been tolerable.   Other medical conditions are listed as follows:   Subacromial bursitis of left shoulder joint: He continues to experience intermittent  discomfort and stiffness in the left shoulder joint  Neuropathy - He is followed by neurologist.   History of gastroesophageal reflux (GERD)  Other fatigue: Chronic   Orders: No orders of the defined types were placed in this encounter.  No orders of the defined types were placed in this encounter.   Follow-Up Instructions: Return in about 3 months (around 04/09/2021) for Ankylosing Spondylitis, DDD.   Gearldine Bienenstock, PA-C  Note - This record has been created using Dragon software.  Chart creation errors have been sought, but may not always  have been located. Such creation errors do not reflect on  the standard of medical care.

## 2021-01-03 DIAGNOSIS — L821 Other seborrheic keratosis: Secondary | ICD-10-CM | POA: Diagnosis not present

## 2021-01-03 DIAGNOSIS — L578 Other skin changes due to chronic exposure to nonionizing radiation: Secondary | ICD-10-CM | POA: Diagnosis not present

## 2021-01-03 DIAGNOSIS — D225 Melanocytic nevi of trunk: Secondary | ICD-10-CM | POA: Diagnosis not present

## 2021-01-07 ENCOUNTER — Other Ambulatory Visit: Payer: Self-pay

## 2021-01-07 ENCOUNTER — Encounter: Payer: Self-pay | Admitting: Physician Assistant

## 2021-01-07 ENCOUNTER — Ambulatory Visit (INDEPENDENT_AMBULATORY_CARE_PROVIDER_SITE_OTHER): Payer: BC Managed Care – PPO | Admitting: Physician Assistant

## 2021-01-07 VITALS — BP 144/84 | HR 80 | Resp 16 | Ht 72.0 in | Wt 193.0 lb

## 2021-01-07 DIAGNOSIS — M5030 Other cervical disc degeneration, unspecified cervical region: Secondary | ICD-10-CM | POA: Diagnosis not present

## 2021-01-07 DIAGNOSIS — M5136 Other intervertebral disc degeneration, lumbar region: Secondary | ICD-10-CM

## 2021-01-07 DIAGNOSIS — Z79899 Other long term (current) drug therapy: Secondary | ICD-10-CM | POA: Diagnosis not present

## 2021-01-07 DIAGNOSIS — R5383 Other fatigue: Secondary | ICD-10-CM

## 2021-01-07 DIAGNOSIS — M5134 Other intervertebral disc degeneration, thoracic region: Secondary | ICD-10-CM

## 2021-01-07 DIAGNOSIS — M450 Ankylosing spondylitis of multiple sites in spine: Secondary | ICD-10-CM | POA: Diagnosis not present

## 2021-01-07 DIAGNOSIS — Z8719 Personal history of other diseases of the digestive system: Secondary | ICD-10-CM

## 2021-01-07 DIAGNOSIS — M461 Sacroiliitis, not elsewhere classified: Secondary | ICD-10-CM

## 2021-01-07 DIAGNOSIS — M7552 Bursitis of left shoulder: Secondary | ICD-10-CM

## 2021-01-07 DIAGNOSIS — G629 Polyneuropathy, unspecified: Secondary | ICD-10-CM

## 2021-01-07 NOTE — Patient Instructions (Signed)
Standing Labs We placed an order today for your standing lab work.   Please have your standing labs drawn in July and every 3 months   If possible, please have your labs drawn 2 weeks prior to your appointment so that the provider can discuss your results at your appointment.  Please note that you may see your imaging and lab results in MyChart before we have reviewed them. We may be awaiting multiple results to interpret others before contacting you. Please allow our office up to 72 hours to thoroughly review all of the results before contacting the office for clarification of your results.  We have open lab daily: Monday through Thursday from 1:30-4:30 PM and Friday from 1:30-4:00 PM at the office of Dr. Shaili Deveshwar, [facility].   Please be advised, all patients with office appointments requiring lab work will take precedent over walk-in lab work.  If possible, please come for your lab work on Monday and Friday afternoons, as you may experience shorter wait times. The office is located at [address], [address], [place], [place] [address] No appointment is necessary.   Labs are drawn by [facility]. Please bring your co-pay at the time of your lab draw.  You may receive a bill from [facility] for your lab work.  If you wish to have your labs drawn at another location, please call the office 24 hours in advance to send orders.  If you have any questions regarding directions or hours of operation,  please call [phone].   As a reminder, please drink plenty of water prior to coming for your lab work. Thanks!  

## 2021-01-30 ENCOUNTER — Telehealth: Payer: Self-pay | Admitting: *Deleted

## 2021-01-30 NOTE — Telephone Encounter (Signed)
Received fax from [facility] stating they have been trying to reach patient to refill Taltz. They have been unsuccessful in reaching patient. Call back number is [phone]. Patient advised to contact the pharmacy to set up shipment.  Patient expressed understanding and will call to set up shipment.

## 2021-02-27 ENCOUNTER — Other Ambulatory Visit: Payer: Self-pay | Admitting: Rheumatology

## 2021-02-27 DIAGNOSIS — M458 Ankylosing spondylitis sacral and sacrococcygeal region: Secondary | ICD-10-CM

## 2021-02-27 NOTE — Telephone Encounter (Signed)
Next Visit: 04/16/2021  Last Visit: 01/07/2021  Last Fill: 12/17/2020  DX:Ankylosing spondylitis of multiple sites in spine  Current Dose per office note 01/07/2021: Taltz 80 mg sq injections every 28 days  Labs: 11/19/2020 CBC is normal.  Liver function is mildly elevated.  Calcium is mildlydecreased.   TB Gold: 05/29/2020 Neg    Left message to advise patient he is due to update labs.   Okay to refill Taltz?

## 2021-03-04 ENCOUNTER — Other Ambulatory Visit: Payer: Self-pay

## 2021-03-04 DIAGNOSIS — Z79899 Other long term (current) drug therapy: Secondary | ICD-10-CM

## 2021-03-05 LAB — CBC WITH DIFFERENTIAL/PLATELET
Absolute Monocytes: 818 cells/uL (ref 200–950)
Basophils Absolute: 44 cells/uL (ref 0–200)
Basophils Relative: 0.5 %
Eosinophils Absolute: 44 cells/uL (ref 15–500)
Eosinophils Relative: 0.5 %
HCT: 45.4 % (ref 38.5–50.0)
Hemoglobin: 15.3 g/dL (ref 13.2–17.1)
Lymphs Abs: 1070 cells/uL (ref 850–3900)
MCH: 32.7 pg (ref 27.0–33.0)
MCHC: 33.7 g/dL (ref 32.0–36.0)
MCV: 97 fL (ref 80.0–100.0)
MPV: 9.3 fL (ref 7.5–12.5)
Monocytes Relative: 9.4 %
Neutro Abs: 6725 cells/uL (ref 1500–7800)
Neutrophils Relative %: 77.3 %
Platelets: 250 10*3/uL (ref 140–400)
RBC: 4.68 10*6/uL (ref 4.20–5.80)
RDW: 12.6 % (ref 11.0–15.0)
Total Lymphocyte: 12.3 %
WBC: 8.7 10*3/uL (ref 3.8–10.8)

## 2021-03-05 LAB — COMPLETE METABOLIC PANEL WITH GFR
AG Ratio: 1.7 (calc) (ref 1.0–2.5)
ALT: 30 U/L (ref 9–46)
AST: 28 U/L (ref 10–35)
Albumin: 4.2 g/dL (ref 3.6–5.1)
Alkaline phosphatase (APISO): 49 U/L (ref 35–144)
BUN: 16 mg/dL (ref 7–25)
CO2: 28 mmol/L (ref 20–32)
Calcium: 9.6 mg/dL (ref 8.6–10.3)
Chloride: 102 mmol/L (ref 98–110)
Creat: 0.92 mg/dL (ref 0.70–1.30)
Globulin: 2.5 g/dL (calc) (ref 1.9–3.7)
Glucose, Bld: 89 mg/dL (ref 65–99)
Potassium: 4.1 mmol/L (ref 3.5–5.3)
Sodium: 140 mmol/L (ref 135–146)
Total Bilirubin: 0.5 mg/dL (ref 0.2–1.2)
Total Protein: 6.7 g/dL (ref 6.1–8.1)
eGFR: 101 mL/min/{1.73_m2} (ref >=60)

## 2021-04-02 NOTE — Progress Notes (Signed)
Office Visit Note  Patient: Jon Henderson             Date of Birth: Apr 03, 1971           MRN: [id number]             PCP: [name], NP Referring: [name], NP Visit Date: 04/16/2021 Occupation: @GUAROCC @  Subjective:  Medication management.   History of Present Illness: Jon Henderson is a 50 y.o. male with history of ankylosing spondylitis and osteoarthritis.  He states he continues to have some stiffness in multiple joints.  He works long hours and has been doing 2 jobs.  He states that he continues to have stiffness in his neck, upper and lower back.  He has been doing stretching exercises also works on a regular basis.  He has gained some muscle mass back from previous neuropathy.  He still feels weak all over.  He complains of discomfort in his hands especially in the right first DIP joint.  Activities of Daily Living:  Patient reports morning stiffness for 4-5 hours.   Patient Reports nocturnal pain.  Difficulty dressing/grooming: Reports Difficulty climbing stairs: Denies Difficulty getting out of chair: Denies Difficulty using hands for taps, buttons, cutlery, and/or writing: Reports  Review of Systems  Constitutional:  Positive for fatigue.  HENT:  Negative for mouth sores, mouth dryness and nose dryness.   Eyes:  Negative for pain, itching, visual disturbance and dryness.  Respiratory:  Positive for shortness of breath. Negative for cough, hemoptysis and difficulty breathing.   Cardiovascular:  Negative for chest pain, palpitations and swelling in legs/feet.  Gastrointestinal:  Negative for abdominal pain, blood in stool, constipation and diarrhea.  Endocrine: Negative for increased urination.  Genitourinary:  Negative for painful urination.  Musculoskeletal:  Positive for joint pain, joint pain, joint swelling, myalgias, muscle weakness, morning stiffness, muscle tenderness and myalgias.  Skin:  Negative for color change, rash and redness.   Allergic/Immunologic: Negative for susceptible to infections.  Neurological:  Positive for numbness and weakness. Negative for dizziness, headaches and memory loss.  Hematological:  Negative for swollen glands.  Psychiatric/Behavioral:  Positive for sleep disturbance. Negative for confusion.    PMFS History:  Patient Active Problem List   Diagnosis Date Noted   Ankylosing spondylitis (HCC) 06/12/2016   Sacroiliitis (HCC) 06/12/2016   High risk medication use 06/12/2016   ABNORMAL HEART RHYTHMS 04/20/2008   DDD (degenerative disc disease), lumbar 04/20/2008   COUGH 04/20/2008    Past Medical History:  Diagnosis Date   Ankylosing spondylitis (HCC)    Disc disorder of lumbar region     Family History  Problem Relation Age of Onset   Ankylosing spondylitis Mother    COPD Father    Drug abuse Sister    Alcohol abuse Sister    Stroke Maternal Grandfather    Healthy Daughter    ADD / ADHD Daughter    Healthy Daughter    ADD / ADHD Daughter    Healthy Daughter    Past Surgical History:  Procedure Laterality Date   LUMBAR DISC SURGERY     Social History   Social History Narrative   Not on file   Immunization History  Administered Date(s) Administered   Influenza-Unspecified 03/23/2018   PFIZER(Purple Top)SARS-COV-2 Vaccination 08/29/2019, 09/19/2019     Objective: Vital Signs: BP (!) 142/93 (BP Location: Left Arm, Patient Position: Sitting, Cuff Size: Normal)   Pulse 80   Ht 6' (1.829 m)   Wt 195  lb 9.6 oz (88.7 kg)   BMI 26.53 kg/m    Physical Exam Vitals and nursing note reviewed.  Constitutional:      Appearance: He is well-developed.  HENT:     Head: Normocephalic and atraumatic.  Eyes:     Conjunctiva/sclera: Conjunctivae normal.     Pupils: Pupils are equal, round, and reactive to light.  Cardiovascular:     Rate and Rhythm: Normal rate and regular rhythm.     Heart sounds: Normal heart sounds.  Pulmonary:     Effort: Pulmonary effort is normal.      Breath sounds: Normal breath sounds.  Abdominal:     General: Bowel sounds are normal.     Palpations: Abdomen is soft.  Musculoskeletal:     Cervical back: Normal range of motion and neck supple.  Skin:    General: Skin is warm and dry.     Capillary Refill: Capillary refill takes less than 2 seconds.  Neurological:     Mental Status: He is alert and oriented to person, place, and time.  Psychiatric:        Behavior: Behavior normal.     Musculoskeletal Exam: C-spine, thoracic and lumbar spine were in good range of motion.  He had no SI joint tenderness.  Shoulder joints, elbow joints, wrist joints with good range of motion.  He had DIP thickening.  Hip joints and knee joints in good range of motion.  There was no tenderness over ankles or MTPs.  CDAI Exam: CDAI Score: -- Patient Global: --; Provider Global: -- Swollen: --; Tender: -- Joint Exam 04/16/2021   No joint exam has been documented for this visit   There is currently no information documented on the homunculus. Go to the Rheumatology activity and complete the homunculus joint exam.  Investigation: No additional findings.  Imaging: No results found.  Recent Labs: Lab Results  Component Value Date   WBC 8.7 03/04/2021   HGB 15.3 03/04/2021   PLT 250 03/04/2021   NA 140 03/04/2021   K 4.1 03/04/2021   CL 102 03/04/2021   CO2 28 03/04/2021   GLUCOSE 89 03/04/2021   BUN 16 03/04/2021   CREATININE 0.92 03/04/2021   BILITOT 0.5 03/04/2021   ALKPHOS 64 07/16/2018   AST 28 03/04/2021   ALT 30 03/04/2021   PROT 6.7 03/04/2021   ALBUMIN 4.3 07/16/2018   CALCIUM 9.6 03/04/2021   GFRAA 94 11/19/2020   QFTBGOLDPLUS NEGATIVE 05/29/2020    Speciality Comments: No specialty comments available.  Procedures:  No procedures performed Allergies: Patient has no known allergies.   Assessment / Plan:     Visit Diagnoses: Ankylosing spondylitis of multiple sites in spine (HCC)-he continues to have pain and stiffness  in multiple joints.  He gives history of morning stiffness.  I do not see any synovitis on my examination.  I believe some of the discomfort is coming from underlying disc disease.  High risk medication use - Taltz 80 mg sq injections every 28 days.  Previously had inadequate response to Cosentyx, Humira, and Enbrel.  His labs are stable.  Labs from August 2022 were within normal limits.  TB gold was negative in November 2021.  He has been advised to get labs in November along with TB Gold.  We will continue to monitor labs every 3 months.  He has been advised to stop Taltz in case he develops an infection and resume once the infection resolves.  Information regarding realization was also placed in  the AVS.  Sacroiliitis (HCC)-he had good flexibility and no tenderness on the palpation of SI joints.  DDD (degenerative disc disease), cervical-he had good range of motion.  DDD (degenerative disc disease), thoracic-good range of motion without tenderness.  DDD (degenerative disc disease), lumbar-he complains of tenderness.  He had good flexibility and was able to reach his toes.  Subacromial bursitis of left shoulder joint-he complains of off-and-on discomfort.  He had no tenderness today.  Neuropathy -he has not seen the neurologist in a while.Marland Kitchen  History of gastroesophageal reflux (GERD)  Other fatigue  Orders: Orders Placed This Encounter  Procedures   QuantiFERON-TB Gold Plus    No orders of the defined types were placed in this encounter.    Follow-Up Instructions: Return in about 5 months (around 09/16/2021) for Ankylosing spondylitis.   Pollyann Savoy, MD  Note - This record has been created using Animal nutritionist.  Chart creation errors have been sought, but may not always  have been located. Such creation errors do not reflect on  the standard of medical care.

## 2021-04-16 ENCOUNTER — Ambulatory Visit: Payer: BC Managed Care – PPO | Admitting: Rheumatology

## 2021-04-16 ENCOUNTER — Encounter: Payer: Self-pay | Admitting: Rheumatology

## 2021-04-16 ENCOUNTER — Other Ambulatory Visit: Payer: Self-pay

## 2021-04-16 VITALS — BP 142/93 | HR 80 | Ht 72.0 in | Wt 195.6 lb

## 2021-04-16 DIAGNOSIS — Z79899 Other long term (current) drug therapy: Secondary | ICD-10-CM | POA: Diagnosis not present

## 2021-04-16 DIAGNOSIS — M461 Sacroiliitis, not elsewhere classified: Secondary | ICD-10-CM

## 2021-04-16 DIAGNOSIS — M7552 Bursitis of left shoulder: Secondary | ICD-10-CM

## 2021-04-16 DIAGNOSIS — M5030 Other cervical disc degeneration, unspecified cervical region: Secondary | ICD-10-CM

## 2021-04-16 DIAGNOSIS — M450 Ankylosing spondylitis of multiple sites in spine: Secondary | ICD-10-CM

## 2021-04-16 DIAGNOSIS — M5134 Other intervertebral disc degeneration, thoracic region: Secondary | ICD-10-CM

## 2021-04-16 DIAGNOSIS — G629 Polyneuropathy, unspecified: Secondary | ICD-10-CM

## 2021-04-16 DIAGNOSIS — M5135 Other intervertebral disc degeneration, thoracolumbar region: Secondary | ICD-10-CM | POA: Diagnosis not present

## 2021-04-16 DIAGNOSIS — Z8719 Personal history of other diseases of the digestive system: Secondary | ICD-10-CM

## 2021-04-16 DIAGNOSIS — M5136 Other intervertebral disc degeneration, lumbar region: Secondary | ICD-10-CM

## 2021-04-16 DIAGNOSIS — R5383 Other fatigue: Secondary | ICD-10-CM

## 2021-04-16 NOTE — Patient Instructions (Signed)
Standing Labs We placed an order today for your standing lab work.   Please have your standing labs drawn in November and every 3 months  If possible, please have your labs drawn 2 weeks prior to your appointment so that the provider can discuss your results at your appointment.  Please note that you may see your imaging and lab results in MyChart before we have reviewed them. We may be awaiting multiple results to interpret others before contacting you. Please allow our office up to 72 hours to thoroughly review all of the results before contacting the office for clarification of your results.  We have open lab daily: Monday through Thursday from 1:30-4:30 PM and Friday from 1:30-4:00 PM at the office of Dr. Pollyann Savoy, [facility].   Please be advised, all patients with office appointments requiring lab work will take precedent over walk-in lab work.  If possible, please come for your lab work on Monday and Friday afternoons, as you may experience shorter wait times. The office is located at [address], [place], [place] [address] No appointment is necessary.   Labs are drawn by [facility]. Please bring your co-pay at the time of your lab draw.  You may receive a bill from [facility] for your lab work.  If you wish to have your labs drawn at another location, please call the office 24 hours in advance to send orders.  If you have any questions regarding directions or hours of operation,  please call [phone].   As a reminder, please drink plenty of water prior to coming for your lab work. Thanks!   If you test POSITIVE for COVID19 and have MILD to MODERATE symptoms: First, call your PCP if you would like to receive COVID19 treatment AND Hold your medications during the infection and for at least 1 week after your symptoms have resolved: Injectable medication (Benlysta, Cimzia, Cosentyx, Enbrel, Humira, Orencia, Remicade, Simponi, Stelara, Taltz,  Tremfya) Methotrexate Leflunomide (Arava) Azathioprine Mycophenolate (Cellcept) Osborne Oman, or Rinvoq Otezla If you take Actemra or Kevzara, you DO NOT need to hold these for COVID19 infection.  If you test POSITIVE for COVID19 and have NO symptoms: First, call your PCP if you would like to receive COVID19 treatment AND Hold your medications for at least 10 days after the day that you tested positive Injectable medication (Benlysta, Cimzia, Cosentyx, Enbrel, Humira, Orencia, Remicade, Simponi, Stelara, Taltz, Tremfya) Methotrexate Leflunomide (Arava) Azathioprine Mycophenolate (Cellcept) Osborne Oman, or Rinvoq Otezla If you take Actemra or Kevzara, you DO NOT need to hold these for COVID19 infection.  If you have signs or symptoms of an infection or start antibiotics: First, call your PCP for workup of your infection. Hold your medication through the infection, until you complete your antibiotics, and until symptoms resolve if you take the following: Injectable medication (Actemra, Benlysta, Cimzia, Cosentyx, Enbrel, Humira, Kevzara, Orencia, Remicade, Simponi, Stelara, Taltz, Tremfya) Methotrexate Leflunomide (Arava) Mycophenolate (Cellcept) Harriette Ohara, Olumiant, or Rinvoq  Vaccines You are taking a medication(s) that can suppress your immune system.  The following immunizations are recommended: Flu annually Covid-19  Td/Tdap (tetanus, diphtheria, pertussis) every 10 years Pneumonia (Prevnar 15 then Pneumovax 23 at least 1 year apart.  Alternatively, can take Prevnar 20 without needing additional dose) Shingrix: 2 doses from 4 weeks to 6 months apart  Please check with your PCP to make sure you are up to date.

## 2021-04-19 ENCOUNTER — Other Ambulatory Visit: Payer: Self-pay | Admitting: Physician Assistant

## 2021-04-19 DIAGNOSIS — M458 Ankylosing spondylitis sacral and sacrococcygeal region: Secondary | ICD-10-CM

## 2021-04-19 NOTE — Telephone Encounter (Signed)
Next Visit: 09/17/2021  Last Visit: 04/16/2021  Last Fill: 02/27/2021 ( 30 day supply)  DX:AJOINOMVEH spondylitis of multiple sites in spine   Current Dose per office note 04/16/2021: Taltz 80 mg sq injections every 28 days  Labs: 03/04/2021 CBC and CMP WNL  TB Gold: 05/29/2020 Neg    Okay to refill Taltz?

## 2021-05-28 ENCOUNTER — Telehealth: Payer: Self-pay | Admitting: *Deleted

## 2021-05-28 NOTE — Telephone Encounter (Signed)
Received fax from [facility] stating they have been trying to reach patient to refill Taltz. They have been unsuccessful in reaching patient. Call back number is [phone]. Left message to advise patient to contact the pharmacy at [phone] to set up shipment.

## 2021-07-05 ENCOUNTER — Telehealth: Payer: Self-pay | Admitting: *Deleted

## 2021-07-05 NOTE — Telephone Encounter (Signed)
Received fax from [facility] stating they have been trying to reach patient to refill Taltz. They have been unsuccessful in reaching patient. Call back number is [phone]. Left message to advise patient to contact the pharmacy at [phone] to set up shipment.

## 2021-07-25 ENCOUNTER — Other Ambulatory Visit: Payer: Self-pay | Admitting: Rheumatology

## 2021-07-25 DIAGNOSIS — M458 Ankylosing spondylitis sacral and sacrococcygeal region: Secondary | ICD-10-CM

## 2021-08-08 ENCOUNTER — Other Ambulatory Visit: Payer: Self-pay | Admitting: Rheumatology

## 2021-08-08 DIAGNOSIS — M458 Ankylosing spondylitis sacral and sacrococcygeal region: Secondary | ICD-10-CM

## 2021-08-14 ENCOUNTER — Other Ambulatory Visit: Payer: Self-pay | Admitting: *Deleted

## 2021-08-14 DIAGNOSIS — Z79899 Other long term (current) drug therapy: Secondary | ICD-10-CM

## 2021-08-15 ENCOUNTER — Other Ambulatory Visit: Payer: Self-pay

## 2021-08-15 DIAGNOSIS — M458 Ankylosing spondylitis sacral and sacrococcygeal region: Secondary | ICD-10-CM

## 2021-08-15 MED ORDER — TALTZ 80 MG/ML ~~LOC~~ SOAJ: SUBCUTANEOUS | 0 refills | Status: DC

## 2021-08-15 NOTE — Telephone Encounter (Signed)
Patient requested a refill of taltz.   Next Visit: 09/17/2021  Last Visit: 04/16/2021  Last Fill: 04/19/2021  DX:  Ankylosing spondylitis of multiple sites in spine  Current Dose per office note on 04/16/2021: Taltz 80 mg sq injections every 28 days.   Labs: 08/14/2021 Glucose is 111. Rest of CMP WNL.  CBC WNL.   TB Gold: 08/14/2021 pending    Okay to refill taltz?

## 2021-08-17 LAB — CBC WITH DIFFERENTIAL/PLATELET
Absolute Monocytes: 847 cells/uL (ref 200–950)
Basophils Absolute: 28 cells/uL (ref 0–200)
Basophils Relative: 0.4 %
Eosinophils Absolute: 42 cells/uL (ref 15–500)
Eosinophils Relative: 0.6 %
HCT: 47.6 % (ref 38.5–50.0)
Hemoglobin: 15.9 g/dL (ref 13.2–17.1)
Lymphs Abs: 1309 cells/uL (ref 850–3900)
MCH: 32.1 pg (ref 27.0–33.0)
MCHC: 33.4 g/dL (ref 32.0–36.0)
MCV: 96.2 fL (ref 80.0–100.0)
MPV: 9.7 fL (ref 7.5–12.5)
Monocytes Relative: 12.1 %
Neutro Abs: 4774 cells/uL (ref 1500–7800)
Neutrophils Relative %: 68.2 %
Platelets: 245 10*3/uL (ref 140–400)
RBC: 4.95 10*6/uL (ref 4.20–5.80)
RDW: 12.4 % (ref 11.0–15.0)
Total Lymphocyte: 18.7 %
WBC: 7 10*3/uL (ref 3.8–10.8)

## 2021-08-17 LAB — COMPLETE METABOLIC PANEL WITH GFR
AG Ratio: 1.9 (calc) (ref 1.0–2.5)
ALT: 23 U/L (ref 9–46)
AST: 20 U/L (ref 10–35)
Albumin: 4.5 g/dL (ref 3.6–5.1)
Alkaline phosphatase (APISO): 57 U/L (ref 35–144)
BUN: 17 mg/dL (ref 7–25)
CO2: 32 mmol/L (ref 20–32)
Calcium: 9.4 mg/dL (ref 8.6–10.3)
Chloride: 101 mmol/L (ref 98–110)
Creat: 0.95 mg/dL (ref 0.70–1.30)
Globulin: 2.4 g/dL (calc) (ref 1.9–3.7)
Glucose, Bld: 111 mg/dL — ABNORMAL HIGH (ref 65–99)
Potassium: 4.2 mmol/L (ref 3.5–5.3)
Sodium: 139 mmol/L (ref 135–146)
Total Bilirubin: 0.6 mg/dL (ref 0.2–1.2)
Total Protein: 6.9 g/dL (ref 6.1–8.1)
eGFR: 98 mL/min/{1.73_m2} (ref >=60)

## 2021-08-17 LAB — QUANTIFERON-TB GOLD PLUS
Mitogen-NIL: >10 IU/mL
NIL: 0.04 IU/mL
QuantiFERON-TB Gold Plus: NEGATIVE
TB1-NIL: 0.02 IU/mL
TB2-NIL: <0 IU/mL

## 2021-08-18 NOTE — Progress Notes (Signed)
TB Gold is negative.

## 2021-09-03 NOTE — Progress Notes (Signed)
Office Visit Note  Patient: Jon Henderson             Date of Birth: 10/06/1970           MRN: [id number]             PCP: Everardo Beals, NP Referring: Everardo Beals, NP Visit Date: 09/17/2021 Occupation: @GUAROCC @  Subjective:  Arthralgias   History of Present Illness: Jon Henderson is a 51 y.o. male with history of ankylosing spondylitis and osteoarthritis.  Patient remains on Taltz 80 mg sq injections every 28 days.  He has not missed any doses recently.  He denies any signs or symptoms of increasing spondylitis flare recently.  He has not had any SI joint discomfort.  He has been experiencing increased arthralgias and joint stiffness which he attributes to osteoarthritis.  He has been taking Celebrex at bedtime which has been alleviating his symptoms.  He experiences interrupted sleep at night if he does not take Celebrex due to the severity of pain.  He has not had any Achilles denies or planter fasciitis.  He has had some increased discomfort in his left hip but denies any groin pain currently.  He denies any joint swelling at this time. 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 He has not had any recent infections.    Activities of Daily Living:  Patient reports morning stiffness for 1.5 hours.   Patient Denies nocturnal pain.  Difficulty dressing/grooming: Denies Difficulty climbing stairs: Denies Difficulty getting out of chair: Denies Difficulty using hands for taps, buttons, cutlery, and/or writing: Reports  Review of Systems  Constitutional:  Positive for fatigue.  HENT:  Negative for mouth dryness.   Eyes:  Negative for dryness.  Respiratory:  Positive for shortness of breath.   Cardiovascular:  Negative for swelling in legs/feet.  Gastrointestinal:  Negative for constipation.  Endocrine: Positive for heat intolerance.  Genitourinary:  Negative for difficulty urinating.   Musculoskeletal:  Positive for joint pain, joint pain, joint swelling, muscle weakness, morning stiffness and muscle tenderness.  Skin:  Positive for rash.  Allergic/Immunologic: Negative for susceptible to infections.  Neurological:  Positive for weakness.  Hematological:  Negative for bruising/bleeding tendency.  Psychiatric/Behavioral:  Positive for sleep disturbance.    PMFS History:  Patient Active Problem List   Diagnosis Date Noted   Ankylosing spondylitis ([place]) 06/12/2016   Sacroiliitis ([place]) 06/12/2016   High risk medication use 06/12/2016   ABNORMAL HEART RHYTHMS 04/20/2008   DDD (degenerative disc disease), lumbar 04/20/2008   COUGH 04/20/2008    Past Medical History:  Diagnosis Date   Ankylosing spondylitis ([place])    Disc disorder of lumbar region     Family History  Problem Relation Age of Onset   Ankylosing spondylitis Mother    COPD Father    Drug abuse Sister    Alcohol abuse Sister    Stroke Maternal Grandfather    Healthy Daughter    ADD / ADHD Daughter    Healthy Daughter    ADD / ADHD Daughter    Healthy Daughter    Past Surgical History:  Procedure Laterality Date   LUMBAR DISC SURGERY     Social History   Social History Narrative   Not on file   Immunization History  Administered Date(s) Administered   Influenza-Unspecified 03/23/2018   PFIZER(Purple Top)SARS-COV-2 Vaccination 08/29/2019, 09/19/2019     Objective: Vital Signs: BP Marland Kitchen)  135/92 (BP Location: Left Arm, Patient Position: Sitting, Cuff Size: Normal)    Pulse 72    Resp 15    Ht 6' (1.829 m)    Wt 200 lb (90.7 kg)    BMI 27.12 kg/m    Physical Exam Vitals and nursing note reviewed.  Constitutional:      Appearance: He is well-developed.  HENT:     Head: Normocephalic and atraumatic.  Eyes:     Conjunctiva/sclera: Conjunctivae normal.     Pupils: Pupils are equal, round, and reactive to light.  Cardiovascular:     Rate and Rhythm: Normal rate and regular rhythm.     Heart  sounds: Normal heart sounds.  Pulmonary:     Effort: Pulmonary effort is normal.     Breath sounds: Normal breath sounds.  Abdominal:     General: Bowel sounds are normal.     Palpations: Abdomen is soft.  Musculoskeletal:     Cervical back: Normal range of motion and neck supple.  Skin:    General: Skin is warm and dry.     Capillary Refill: Capillary refill takes less than 2 seconds.  Neurological:     Mental Status: He is alert and oriented to person, place, and time.  Psychiatric:        Behavior: Behavior normal.     Musculoskeletal Exam: C-spine, thoracic spine, lumbar spine have good range of motion.  No midline spinal tenderness or SI joint tenderness was noted.  Painful range of motion and tenderness of the right shoulder joint on examination.  Left shoulder has good range of motion with no discomfort or tenderness.  Elbow joints, wrist joints, MCPs, PIPs, DIPs have good range of motion with no synovitis.  DIP thickening noted.  Hip joints have good range of motion with some discomfort in the left hip.  Some tenderness over the left trochanteric bursa.  Knee joints have good range of motion with no warmth or effusion.  Ankle joints have good range of motion with no tenderness or joint swelling.  No evidence of Achilles tendinitis.  CDAI Exam: CDAI Score: -- Patient Global: --; Provider Global: -- Swollen: --; Tender: -- Joint Exam 09/17/2021   No joint exam has been documented for this visit   There is currently no information documented on the homunculus. Go to the Rheumatology activity and complete the homunculus joint exam.  Investigation: No additional findings.  Imaging: No results found.  Recent Labs: Lab Results  Component Value Date   WBC 7.0 08/14/2021   HGB 15.9 08/14/2021   PLT 245 08/14/2021   NA 139 08/14/2021   K 4.2 08/14/2021   CL 101 08/14/2021   CO2 32 08/14/2021   GLUCOSE 111 (H) 08/14/2021   BUN 17 08/14/2021   CREATININE 0.95 08/14/2021    BILITOT 0.6 08/14/2021   ALKPHOS 64 07/16/2018   AST 20 08/14/2021   ALT 23 08/14/2021   PROT 6.9 08/14/2021   ALBUMIN 4.3 07/16/2018   CALCIUM 9.4 08/14/2021   GFRAA 94 11/19/2020   QFTBGOLDPLUS NEGATIVE 08/14/2021    Speciality Comments: No specialty comments available.  Procedures:  No procedures performed Allergies: Patient has no known allergies.   Assessment / Plan:     Visit Diagnoses: Ankylosing spondylitis of multiple sites in spine [facility]): He has not had any signs or symptoms of an ankylosing spondylitis flare recently.  He has clinically been doing well on Taltz 80 mg subcutaneous injections every 28 days.  He continues to tolerate  Taltz without any side effects or injection site reactions.  He has not missed any doses of Taltz recently and has not had any recent infections.  He has no midline spinal tenderness or SI joint tenderness on examination today.  He has no evidence of Achilles tendinitis or plantar fasciitis.  He has no synovitis or dactylitis on examination.  He has been experiencing intermittent arthralgias and joint stiffness which she attributes to underlying osteoarthritis.  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 In the meantime he will remain on Taltz as prescribed.  He was advised to notify us if he develops signs or symptoms of a flare.  He will follow-up in the office in 5 months.  High risk medication use - Taltz 80 mg sq injections every 28 days.  Previously had inadequate response to Cosentyx, Humira, and Enbrel. CBC and CMP drawn on 08/14/2021.  His next lab work will be due in April and every 3 months to monitor for drug toxicity.  Standing orders remain in place. TB Gold negative on 08/14/2021 and will continue to be monitored yearly. He has not had any recent infections. Discussed the  importance of holding Taltz if he develops signs or symptoms of an infection and to resume once the infection is completely cleared.  Sacroiliitis [facility]): He has no SI joint tenderness on examination today.  DDD (degenerative disc disease), cervical: He has good range of motion of the C-spine with no discomfort currently.  No symptoms of radiculopathy.  DDD (degenerative disc disease), thoracic: No midline spinal tenderness.  DDD (degenerative disc disease), lumbar: No midline spinal tenderness.  No symptoms of radiculopathy  Subacromial bursitis of left shoulder joint: Improved.  He has good ROM with no discomfort on examination today.  He had a cortisone injection on 12/13/2018 which provided temporary relief.  He was encouraged to perform stretching exercises daily.   Chronic right shoulder pain: He has been experiencing increased pain in the right shoulder joint.  He has had interrupted sleep at night due to the discomfort in his right shoulder.  I offered a right shoulder joint cortisone injection today but he declined since he has to perform yard work this afternoon.  He was advised to notify us if he would like to return for a right shoulder joint cortisone injection in the future.  He was advised to increase his range of motion and strengthening regimen.  Other medical conditions are listed as follows:   Neuropathy  History of gastroesophageal reflux (GERD)  Other fatigue  Orders: No orders of the defined types were placed in this encounter.  No orders of the defined types were placed in this encounter.     Follow-Up Instructions: Return in about 5 months (around 02/14/2022) for Ankylosing Spondylitis, Osteoarthritis.   Ofilia Neas, PA-C  Note - This record has been created using Dragon software.  Chart creation errors have been sought, but may not always  have been located. Such creation errors do not reflect on  the standard of medical care.

## 2021-09-17 ENCOUNTER — Other Ambulatory Visit: Payer: Self-pay

## 2021-09-17 ENCOUNTER — Ambulatory Visit (INDEPENDENT_AMBULATORY_CARE_PROVIDER_SITE_OTHER): Payer: BC Managed Care – PPO | Admitting: Physician Assistant

## 2021-09-17 ENCOUNTER — Encounter: Payer: Self-pay | Admitting: Physician Assistant

## 2021-09-17 VITALS — BP 135/92 | HR 72 | Resp 15 | Ht 72.0 in | Wt 200.0 lb

## 2021-09-17 DIAGNOSIS — M7552 Bursitis of left shoulder: Secondary | ICD-10-CM

## 2021-09-17 DIAGNOSIS — M450 Ankylosing spondylitis of multiple sites in spine: Secondary | ICD-10-CM

## 2021-09-17 DIAGNOSIS — M25511 Pain in right shoulder: Secondary | ICD-10-CM

## 2021-09-17 DIAGNOSIS — G8929 Other chronic pain: Secondary | ICD-10-CM

## 2021-09-17 DIAGNOSIS — Z79899 Other long term (current) drug therapy: Secondary | ICD-10-CM

## 2021-09-17 DIAGNOSIS — M461 Sacroiliitis, not elsewhere classified: Secondary | ICD-10-CM | POA: Diagnosis not present

## 2021-09-17 DIAGNOSIS — M51369 Other intervertebral disc degeneration, lumbar region without mention of lumbar back pain or lower extremity pain: Secondary | ICD-10-CM

## 2021-09-17 DIAGNOSIS — R5383 Other fatigue: Secondary | ICD-10-CM

## 2021-09-17 DIAGNOSIS — G629 Polyneuropathy, unspecified: Secondary | ICD-10-CM

## 2021-09-17 DIAGNOSIS — M5030 Other cervical disc degeneration, unspecified cervical region: Secondary | ICD-10-CM | POA: Diagnosis not present

## 2021-09-17 DIAGNOSIS — Z8719 Personal history of other diseases of the digestive system: Secondary | ICD-10-CM

## 2021-09-17 DIAGNOSIS — M5136 Other intervertebral disc degeneration, lumbar region: Secondary | ICD-10-CM

## 2021-09-17 DIAGNOSIS — M5134 Other intervertebral disc degeneration, thoracic region: Secondary | ICD-10-CM

## 2021-09-17 NOTE — Patient Instructions (Addendum)
Standing Labs We placed an order today for your standing lab work.   Please have your standing labs drawn in April and every 3 months   If possible, please have your labs drawn 2 weeks prior to your appointment so that the provider can discuss your results at your appointment.  Please note that you may see your imaging and lab results in MyChart before we have reviewed them. We may be awaiting multiple results to interpret others before contacting you. Please allow our office up to 72 hours to thoroughly review all of the results before contacting the office for clarification of your results.  We have open lab daily: Monday through Thursday from 1:30-4:30 PM and Friday from 1:30-4:00 PM at the office of Dr. Pollyann SavoyShaili Deveshwar, [facility].   Please be advised, all patients with office appointments requiring lab work will take precedent over walk-in lab work.  If possible, please come for your lab work on Monday and Friday afternoons, as you may experience shorter wait times. The office is located at [address], [address], [place], [place] [id number] No appointment is necessary.   Labs are drawn by [facility]. Please bring your co-pay at the time of your lab draw.  You may receive a bill from [facility] for your lab work.  Please note if you are on Hydroxychloroquine and and an order has been placed for a Hydroxychloroquine level, you will need to have it drawn 4 hours or more after your last dose.  If you wish to have your labs drawn at another location, please call the office 24 hours in advance to send orders.  If you have any questions regarding directions or hours of operation,  please call [phone].   As a reminder, please drink plenty of water prior to coming for your lab work. Thanks!  Hip Bursitis Rehab Ask your health care provider which exercises are safe for you. Do exercises exactly as told by your health care provider and adjust them as directed. It is normal to feel  mild stretching, pulling, tightness, or discomfort as you do these exercises. Stop right away if you feel sudden pain or your pain gets worse. Do not begin these exercises until told by your health care provider. Stretching exercise This exercise warms up your muscles and joints and improves the movement and flexibility of your hip. This exercise also helps to relieve pain and stiffness. Iliotibial band stretch An iliotibial band is a strong band of muscle tissue that runs from the outer side of your hip to the outer side of your thigh and knee. Lie on your side with your left / right leg in the top position. Bend your left / right knee and grab your ankle. Stretch out your bottom arm to help you balance. Slowly bring your knee back so your thigh is behind your body. Slowly lower your knee toward the floor until you feel a gentle stretch on the outside of your left / right thigh. If you do not feel a stretch and your knee will not fall farther, place the heel of your other foot on top of your knee and pull your knee down toward the floor with your foot. Hold this position for __________ seconds. Slowly return to the starting position. Repeat __________ times. Complete this exercise __________ times a day. Strengthening exercises These exercises build strength and endurance in your hip and pelvis. Endurance is the ability to use your muscles for a long time, even after they get tired.  Bridge This exercise strengthens the muscles that move your thigh backward (hip extensors). Lie on your back on a firm surface with your knees bent and your feet flat on the floor. Tighten your buttocks muscles and lift your buttocks off the floor until your trunk is level with your thighs. Do not arch your back. You should feel the muscles working in your buttocks and the back of your thighs. If you do not feel these muscles, slide your feet 1-2 inches (2.5-5 cm) farther away from your buttocks. If this exercise is  too easy, try doing it with your arms crossed over your chest. Hold this position for __________ seconds. Slowly lower your hips to the starting position. Let your muscles relax completely after each repetition. Repeat __________ times. Complete this exercise __________ times a day. Squats This exercise strengthens the muscles in front of your thigh and knee (quadriceps). Stand in front of a table, with your feet and knees pointing straight ahead. You may rest your hands on the table for balance but not for support. Slowly bend your knees and lower your hips like you are going to sit in a chair. Keep your weight over your heels, not over your toes. Keep your lower legs upright so they are parallel with the table legs. Do not let your hips go lower than your knees. Do not bend lower than told by your health care provider. If your hip pain increases, do not bend as low. Hold the squat position for __________ seconds. Slowly push with your legs to return to standing. Do not use your hands to pull yourself to standing. Repeat __________ times. Complete this exercise __________ times a day. Hip hike Stand sideways on a bottom step. Stand on your left / right leg with your other foot unsupported next to the step. You can hold on to the railing or wall for balance if needed. Keep your knees straight and your torso square. Then lift your left / right hip up toward the ceiling. Hold this position for __________ seconds. Slowly let your left / right hip lower toward the floor, past the starting position. Your foot should get closer to the floor. Do not lean or bend your knees. Repeat __________ times. Complete this exercise __________ times a day. Single leg stand Without shoes, stand near a railing or in a doorway. You may hold on to the railing or door frame as needed for balance. Squeeze your left / right buttock muscles, then lift up your other foot. Do not let your left / right hip push out to  the side. It is helpful to stand in front of a mirror for this exercise so you can watch your hip. Hold this position for __________ seconds. Repeat __________ times. Complete this exercise __________ times a day. This information is not intended to replace advice given to you by your health care provider. Make sure you discuss any questions you have with your health care provider. Document Revised: 11/08/2018 Document Reviewed: 11/08/2018 Elsevier Patient Education  2022 [employer]   Shoulder Exercises Ask your health care provider which exercises are safe for you. Do exercises exactly as told by your health care provider and adjust them as directed. It is normal to feel mild stretching, pulling, tightness, or discomfort as you do these exercises. Stop right away if you feel sudden pain or your pain gets worse. Do not begin these exercises until told by your health care provider. Stretching exercises External rotation and abduction This exercise  is sometimes called corner stretch. This exercise rotates your arm outward (external rotation) and moves your arm out from your body (abduction). Stand in a doorway with one of your feet slightly in front of the other. This is called a staggered stance. If you cannot reach your forearms to the door frame, stand facing a corner of a room. Choose one of the following positions as told by your health care provider: Place your hands and forearms on the door frame above your head. Place your hands and forearms on the door frame at the height of your head. Place your hands on the door frame at the height of your elbows. Slowly move your weight onto your front foot until you feel a stretch across your chest and in the front of your shoulders. Keep your head and chest upright and keep your abdominal muscles tight. Hold for __________ seconds. To release the stretch, shift your weight to your back foot. Repeat __________ times. Complete this exercise  __________ times a day. Extension, standing Stand and hold a broomstick, a cane, or a similar object behind your back. Your hands should be a little wider than shoulder width apart. Your palms should face away from your back. Keeping your elbows straight and your shoulder muscles relaxed, move the stick away from your body until you feel a stretch in your shoulders (extension). Avoid shrugging your shoulders while you move the stick. Keep your shoulder blades tucked down toward the middle of your back. Hold for __________ seconds. Slowly return to the starting position. Repeat __________ times. Complete this exercise __________ times a day. Range-of-motion exercises Pendulum  Stand near a wall or a surface that you can hold onto for balance. Bend at the waist and let your left / right arm hang straight down. Use your other arm to support you. Keep your back straight and do not lock your knees. Relax your left / right arm and shoulder muscles, and move your hips and your trunk so your left / right arm swings freely. Your arm should swing because of the motion of your body, not because you are using your arm or shoulder muscles. Keep moving your hips and trunk so your arm swings in the following directions, as told by your health care provider: Side to side. Forward and backward. In clockwise and counterclockwise circles. Continue each motion for __________ seconds, or for as long as told by your health care provider. Slowly return to the starting position. Repeat __________ times. Complete this exercise __________ times a day. Shoulder flexion, standing  Stand and hold a broomstick, a cane, or a similar object. Place your hands a little more than shoulder width apart on the object. Your left / right hand should be palm up, and your other hand should be palm down. Keep your elbow straight and your shoulder muscles relaxed. Push the stick up with your healthy arm to raise your left / right arm  in front of your body, and then over your head until you feel a stretch in your shoulder (flexion). Avoid shrugging your shoulder while you raise your arm. Keep your shoulder blade tucked down toward the middle of your back. Hold for __________ seconds. Slowly return to the starting position. Repeat __________ times. Complete this exercise __________ times a day. Shoulder abduction, standing Stand and hold a broomstick, a cane, or a similar object. Place your hands a little more than shoulder width apart on the object. Your left / right hand should be palm up,  and your other hand should be palm down. Keep your elbow straight and your shoulder muscles relaxed. Push the object across your body toward your left / right side. Raise your left / right arm to the side of your body (abduction) until you feel a stretch in your shoulder. Do not raise your arm above shoulder height unless your health care provider tells you to do that. If directed, raise your arm over your head. Avoid shrugging your shoulder while you raise your arm. Keep your shoulder blade tucked down toward the middle of your back. Hold for __________ seconds. Slowly return to the starting position. Repeat __________ times. Complete this exercise __________ times a day. Internal rotation  Place your left / right hand behind your back, palm up. Use your other hand to dangle an exercise band, a towel, or a similar object over your shoulder. Grasp the band with your left / right hand so you are holding on to both ends. Gently pull up on the band until you feel a stretch in the front of your left / right shoulder. The movement of your arm toward the center of your body is called internal rotation. Avoid shrugging your shoulder while you raise your arm. Keep your shoulder blade tucked down toward the middle of your back. Hold for __________ seconds. Release the stretch by letting go of the band and lowering your hands. Repeat __________  times. Complete this exercise __________ times a day. Strengthening exercises External rotation  Sit in a stable chair without armrests. Secure an exercise band to a stable object at elbow height on your left / right side. Place a soft object, such as a folded towel or a small pillow, between your left / right upper arm and your body to move your elbow about 4 inches (10 cm) away from your side. Hold the end of the exercise band so it is tight and there is no slack. Keeping your elbow pressed against the soft object, slowly move your forearm out, away from your abdomen (external rotation). Keep your body steady so only your forearm moves. Hold for __________ seconds. Slowly return to the starting position. Repeat __________ times. Complete this exercise __________ times a day. Shoulder abduction  Sit in a stable chair without armrests, or stand up. Hold a __________ weight in your left / right hand, or hold an exercise band with both hands. Start with your arms straight down and your left / right palm facing in, toward your body. Slowly lift your left / right hand out to your side (abduction). Do not lift your hand above shoulder height unless your health care provider tells you that this is safe. Keep your arms straight. Avoid shrugging your shoulder while you do this movement. Keep your shoulder blade tucked down toward the middle of your back. Hold for __________ seconds. Slowly lower your arm, and return to the starting position. Repeat __________ times. Complete this exercise __________ times a day. Shoulder extension Sit in a stable chair without armrests, or stand up. Secure an exercise band to a stable object in front of you so it is at shoulder height. Hold one end of the exercise band in each hand. Your palms should face each other. Straighten your elbows and lift your hands up to shoulder height. Step back, away from the secured end of the exercise band, until the band is tight  and there is no slack. Squeeze your shoulder blades together as you pull your hands down to the sides  of your thighs (extension). Stop when your hands are straight down by your sides. Do not let your hands go behind your body. Hold for __________ seconds. Slowly return to the starting position. Repeat __________ times. Complete this exercise __________ times a day. Shoulder row Sit in a stable chair without armrests, or stand up. Secure an exercise band to a stable object in front of you so it is at waist height. Hold one end of the exercise band in each hand. Position your palms so that your thumbs are facing the ceiling (neutral position). Bend each of your elbows to a 90-degree angle (right angle) and keep your upper arms at your sides. Step back until the band is tight and there is no slack. Slowly pull your elbows back behind you. Hold for __________ seconds. Slowly return to the starting position. Repeat __________ times. Complete this exercise __________ times a day. Shoulder press-ups  Sit in a stable chair that has armrests. Sit upright, with your feet flat on the floor. Put your hands on the armrests so your elbows are bent and your fingers are pointing forward. Your hands should be about even with the sides of your body. Push down on the armrests and use your arms to lift yourself off the chair. Straighten your elbows and lift yourself up as much as you comfortably can. Move your shoulder blades down, and avoid letting your shoulders move up toward your ears. Keep your feet on the ground. As you get stronger, your feet should support less of your body weight as you lift yourself up. Hold for __________ seconds. Slowly lower yourself back into the chair. Repeat __________ times. Complete this exercise __________ times a day. Wall push-ups  Stand so you are facing a stable wall. Your feet should be about one arm-length away from the wall. Lean forward and place your palms on the  wall at shoulder height. Keep your feet flat on the floor as you bend your elbows and lean forward toward the wall. Hold for __________ seconds. Straighten your elbows to push yourself back to the starting position. Repeat __________ times. Complete this exercise __________ times a day. This information is not intended to replace advice given to you by your health care provider. Make sure you discuss any questions you have with your health care provider. Document Revised: 11/05/2018 Document Reviewed: 08/13/2018 Elsevier Patient Education  2022 [employer]

## 2021-10-10 NOTE — Telephone Encounter (Signed)
Yes

## 2021-10-11 NOTE — Telephone Encounter (Signed)
Medication Samples have been provided to the patient. ? ?Drug name: Taltz       Strength: 80 mg        Qty: 1  LOT: [id number]  Exp.Date: March 27, 2023 ? ?Dosing instructions: Inject one pen into skin every 4 weeks.   ?

## 2021-10-16 ENCOUNTER — Telehealth: Payer: Self-pay

## 2021-10-16 NOTE — Telephone Encounter (Signed)
Received fax from [facility] stating that current PA is expiring. ? ?Submitted a Prior Authorization request to [facility] for TALTZ via CoverMyMeds. Will update once we receive a response. ? ? ?Key: [id number] ?

## 2021-10-17 NOTE — Telephone Encounter (Signed)
Received notification from [facility] regarding a prior authorization for TALTZ. Authorization has been APPROVED from 10/16/2021 to 10/17/2022. Approval letter sent to scan center. ? ?Authorization # [id number] ?

## 2021-10-29 ENCOUNTER — Other Ambulatory Visit: Payer: Self-pay | Admitting: Physician Assistant

## 2021-10-29 DIAGNOSIS — M458 Ankylosing spondylitis sacral and sacrococcygeal region: Secondary | ICD-10-CM

## 2021-10-29 NOTE — Telephone Encounter (Signed)
Next Visit: 02/11/2022 ? ?Last Visit: 09/17/2021 ? ?Last Fill: 08/15/2021 ? 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 ?Current Dose per office note 09/17/2021: Taltz 80 mg sq injections every 28 days. ? ?Labs: 08/14/2021 Glucose is 111. Rest of CMP WNL.  CBC WNL.  ? ?TB Gold: 08/14/2021 Neg   ? ?Okay to refill Altamease Oiler?  ?

## 2021-11-15 ENCOUNTER — Telehealth: Payer: Self-pay | Admitting: Pharmacist

## 2021-11-15 NOTE — Telephone Encounter (Signed)
Received prior auth renewal request from [facility] for TALTZ. Previously renewed but re-submitted to err on side of caution. ? ?Key: [id number] ? ?Chesley Mires, PharmD, MPH, BCPS ?Clinical Pharmacist (Rheumatology and Pulmonology) ?

## 2021-11-18 NOTE — Telephone Encounter (Signed)
Received notification from [facility] regarding a prior authorization for TALTZ. Authorization was previously APPROVED from 10/16/21 to 10/17/22.  ? ?Patient can continue to fill through [facility]: [phone]  ? ?Authorization # [id number] ? ?Chesley Mires, PharmD, MPH, BCPS ?Clinical Pharmacist (Rheumatology and Pulmonology) ?

## 2022-01-29 NOTE — Progress Notes (Signed)
Office Visit Note  Patient: Jon Henderson             Date of Birth: 1970-10-21           MRN: [id number]             PCP: [name], NP Referring: [name], NP Visit Date: 02/11/2022 Occupation: @GUAROCC @  Subjective:  Medication management  History of Present Illness: Jon Henderson is a 51 y.o. male with history of ankylosing spondylitis and osteoarthritis.  He states that he has been taking Taltz injection on a monthly basis without any side effects.  He continues to have some joint to stiffness in the morning.  He has not noticed any joint swelling.  He has intermittent discomfort in the SI joints.  He takes Celebrex on as needed basis.  Activities of Daily Living:  Patient reports morning stiffness for 1 hour.   Patient Denies nocturnal pain.  Difficulty dressing/grooming: Denies Difficulty climbing stairs: Denies Difficulty getting out of chair: Denies Difficulty using hands for taps, buttons, cutlery, and/or writing: Denies  Review of Systems  Constitutional:  Negative for fatigue.  HENT:  Negative for mouth sores and mouth dryness.   Eyes:  Negative for dryness.  Respiratory:  Negative for difficulty breathing.   Cardiovascular:  Negative for chest pain and palpitations.  Gastrointestinal:  Negative for constipation and diarrhea.  Endocrine: Negative for increased urination.  Genitourinary:  Negative for involuntary urination.  Musculoskeletal:  Positive for joint pain, joint pain and morning stiffness. Negative for myalgias and myalgias.  Skin:  Negative for color change, redness and sensitivity to sunlight.  Allergic/Immunologic: Negative for susceptible to infections.  Neurological:  Negative for dizziness and headaches.  Hematological:  Negative for swollen glands.  Psychiatric/Behavioral:  Negative for depressed mood and sleep disturbance. The patient is not nervous/anxious.     PMFS History:  Patient Active Problem List   Diagnosis Date  Noted   Ankylosing spondylitis (HCC) 06/12/2016   Sacroiliitis (HCC) 06/12/2016   High risk medication use 06/12/2016   ABNORMAL HEART RHYTHMS 04/20/2008   DDD (degenerative disc disease), lumbar 04/20/2008   COUGH 04/20/2008    Past Medical History:  Diagnosis Date   Ankylosing spondylitis (HCC)    Disc disorder of lumbar region     Family History  Problem Relation Age of Onset   Ankylosing spondylitis Mother    COPD Father    Drug abuse Sister    Alcohol abuse Sister    Stroke Maternal Grandfather    Healthy Daughter    ADD / ADHD Daughter    Healthy Daughter    ADD / ADHD Daughter    Healthy Daughter    Past Surgical History:  Procedure Laterality Date   LUMBAR DISC SURGERY     Social History   Social History Narrative   Not on file   Immunization History  Administered Date(s) Administered   Influenza-Unspecified 03/23/2018   PFIZER(Purple Top)SARS-COV-2 Vaccination 08/29/2019, 09/19/2019     Objective: Vital Signs: BP (!) 146/98 (BP Location: Left Arm, Patient Position: Sitting, Cuff Size: Normal)   Pulse 76   Ht 6' (1.829 m)   Wt 200 lb (90.7 kg)   BMI 27.12 kg/m    Physical Exam Vitals and nursing note reviewed.  Constitutional:      Appearance: He is well-developed.  HENT:     Head: Normocephalic and atraumatic.  Eyes:     Conjunctiva/sclera: Conjunctivae normal.     Pupils: Pupils are equal,  round, and reactive to light.  Cardiovascular:     Rate and Rhythm: Normal rate and regular rhythm.     Heart sounds: Normal heart sounds.  Pulmonary:     Effort: Pulmonary effort is normal.     Breath sounds: Normal breath sounds.  Abdominal:     General: Bowel sounds are normal.     Palpations: Abdomen is soft.  Musculoskeletal:     Cervical back: Normal range of motion and neck supple.  Skin:    General: Skin is warm and dry.     Capillary Refill: Capillary refill takes less than 2 seconds.  Neurological:     Mental Status: He is alert and  oriented to person, place, and time.  Psychiatric:        Behavior: Behavior normal.      Musculoskeletal Exam: C-spine, thoracic and lumbar spine were in good range of motion.  He had no SI joint tenderness on palpation.  Shoulder joints, elbow joints, wrist joints, MCPs PIPs and DIPs with good range of motion with no synovitis.  Hip joints, knee joints, ankles, MTPs and PIPs with good range of motion with no synovitis.  CDAI Exam: CDAI Score: -- Patient Global: --; Provider Global: -- Swollen: --; Tender: -- Joint Exam 02/11/2022   No joint exam has been documented for this visit   There is currently no information documented on the homunculus. Go to the Rheumatology activity and complete the homunculus joint exam.  Investigation: No additional findings.  Imaging: No results found.  Recent Labs: Lab Results  Component Value Date   WBC 7.0 08/14/2021   HGB 15.9 08/14/2021   PLT 245 08/14/2021   NA 139 08/14/2021   K 4.2 08/14/2021   CL 101 08/14/2021   CO2 32 08/14/2021   GLUCOSE 111 (H) 08/14/2021   BUN 17 08/14/2021   CREATININE 0.95 08/14/2021   BILITOT 0.6 08/14/2021   ALKPHOS 64 07/16/2018   AST 20 08/14/2021   ALT 23 08/14/2021   PROT 6.9 08/14/2021   ALBUMIN 4.3 07/16/2018   CALCIUM 9.4 08/14/2021   GFRAA 94 11/19/2020   QFTBGOLDPLUS NEGATIVE 08/14/2021    Speciality Comments: No specialty comments available.  Procedures:  No procedures performed Allergies: Patient has no known allergies.   Assessment / Plan:     Visit Diagnoses: Ankylosing spondylitis of multiple sites in spine (HCC)-he had good mobility in his cervical, thoracic and lumbar spine.  He had no SI joint tenderness.  He has been tolerating Taltz without any side effects.  High risk medication use - Taltz 80 mg sq injections every 28 days.  Previously had inadequate response to Cosentyx, Humira, and Enbrel.  Labs obtained on August 14, 2021 were within normal limits.  TB gold was negative  on August 14, 2021.  We will get labs today.  He was advised to get labs every 3 months to monitor for drug toxicity.  Information regarding medication was placed in the AVS.  He was also advised to hold Taltz if he develops an infection and resume after the infection resolves.  Sacroiliitis (HCC)-he had no SI joint tenderness.  DDD (degenerative disc disease), cervical-he had good range of motion without discomfort.  DDD (degenerative disc disease), thoracic-he had no point tenderness and had good mobility.  DDD (degenerative disc disease), lumbar-he was able to reach his toes without any difficulty.  Chronic right shoulder pain-he had good range of motion but has intermittent discomfort.  Other medical problems listed as follows:  History of gastroesophageal reflux (GERD)  Neuropathy  Other fatigue  Orders: Orders Placed This Encounter  Procedures   CBC with Differential/Platelet   COMPLETE METABOLIC PANEL WITH GFR   No orders of the defined types were placed in this encounter.   Follow-Up Instructions: Return in about 5 months (around 07/14/2022) for Ankylosing spondylitis.   Pollyann Savoy, MD  Note - This record has been created using Animal nutritionist.  Chart creation errors have been sought, but may not always  have been located. Such creation errors do not reflect on  the standard of medical care.

## 2022-01-30 ENCOUNTER — Other Ambulatory Visit: Payer: Self-pay | Admitting: Physician Assistant

## 2022-01-30 DIAGNOSIS — M458 Ankylosing spondylitis sacral and sacrococcygeal region: Secondary | ICD-10-CM

## 2022-02-03 DIAGNOSIS — K141 Geographic tongue: Secondary | ICD-10-CM | POA: Diagnosis not present

## 2022-02-06 ENCOUNTER — Other Ambulatory Visit: Payer: Self-pay | Admitting: Physician Assistant

## 2022-02-06 DIAGNOSIS — M458 Ankylosing spondylitis sacral and sacrococcygeal region: Secondary | ICD-10-CM

## 2022-02-11 ENCOUNTER — Ambulatory Visit (INDEPENDENT_AMBULATORY_CARE_PROVIDER_SITE_OTHER): Payer: BC Managed Care – PPO | Admitting: Rheumatology

## 2022-02-11 ENCOUNTER — Encounter: Payer: Self-pay | Admitting: Rheumatology

## 2022-02-11 VITALS — BP 146/98 | HR 76 | Ht 72.0 in | Wt 200.0 lb

## 2022-02-11 DIAGNOSIS — M25511 Pain in right shoulder: Secondary | ICD-10-CM

## 2022-02-11 DIAGNOSIS — M461 Sacroiliitis, not elsewhere classified: Secondary | ICD-10-CM | POA: Diagnosis not present

## 2022-02-11 DIAGNOSIS — M5134 Other intervertebral disc degeneration, thoracic region: Secondary | ICD-10-CM

## 2022-02-11 DIAGNOSIS — Z79899 Other long term (current) drug therapy: Secondary | ICD-10-CM | POA: Diagnosis not present

## 2022-02-11 DIAGNOSIS — M7552 Bursitis of left shoulder: Secondary | ICD-10-CM

## 2022-02-11 DIAGNOSIS — M5030 Other cervical disc degeneration, unspecified cervical region: Secondary | ICD-10-CM

## 2022-02-11 DIAGNOSIS — M5136 Other intervertebral disc degeneration, lumbar region: Secondary | ICD-10-CM

## 2022-02-11 DIAGNOSIS — G629 Polyneuropathy, unspecified: Secondary | ICD-10-CM

## 2022-02-11 DIAGNOSIS — R5383 Other fatigue: Secondary | ICD-10-CM

## 2022-02-11 DIAGNOSIS — M450 Ankylosing spondylitis of multiple sites in spine: Secondary | ICD-10-CM

## 2022-02-11 DIAGNOSIS — G8929 Other chronic pain: Secondary | ICD-10-CM

## 2022-02-11 DIAGNOSIS — Z8719 Personal history of other diseases of the digestive system: Secondary | ICD-10-CM

## 2022-02-11 NOTE — Patient Instructions (Signed)
Standing Labs We placed an order today for your standing lab work.   Please have your standing labs drawn in October and every 3 months  If possible, please have your labs drawn 2 weeks prior to your appointment so that the provider can discuss your results at your appointment.  Please note that you may see your imaging and lab results in MyChart before we have reviewed them. We may be awaiting multiple results to interpret others before contacting you. Please allow our office up to 72 hours to thoroughly review all of the results before contacting the office for clarification of your results.  We have open lab daily: Monday through Thursday from 1:30-4:30 PM and Friday from 1:30-4:00 PM at the office of Dr. Keeleigh Terris, [facility].   Please be advised, all patients with office appointments requiring lab work will take precedent over walk-in lab work.  If possible, please come for your lab work on Monday and Friday afternoons, as you may experience shorter wait times. The office is located at [address], [address], [place], [place] [address] No appointment is necessary.   Labs are drawn by [facility]. Please bring your co-pay at the time of your lab draw.  You may receive a bill from [facility] for your lab work.  Please note if you are on Hydroxychloroquine and and an order has been placed for a Hydroxychloroquine level, you will need to have it drawn 4 hours or more after your last dose.  If you wish to have your labs drawn at another location, please call the office 24 hours in advance to send orders.  If you have any questions regarding directions or hours of operation,  please call [phone].   As a reminder, please drink plenty of water prior to coming for your lab work. Thanks!   Vaccines You are taking a medication(s) that can suppress your immune system.  The following immunizations are recommended: Flu annually Covid-19  Td/Tdap (tetanus, diphtheria,  pertussis) every 10 years Pneumonia (Prevnar 15 then Pneumovax 23 at least 1 year apart.  Alternatively, can take Prevnar 20 without needing additional dose) Shingrix: 2 doses from 4 weeks to 6 months apart  Please check with your PCP to make sure you are up to date.   If you have signs or symptoms of an infection or start antibiotics: First, call your PCP for workup of your infection. Hold your medication through the infection, until you complete your antibiotics, and until symptoms resolve if you take the following: Injectable medication (Actemra, Benlysta, Cimzia, Cosentyx, Enbrel, Humira, Kevzara, Orencia, Remicade, Simponi, Stelara, Taltz, Tremfya) Methotrexate Leflunomide (Arava) Mycophenolate (Cellcept) Xeljanz, Olumiant, or Rinvoq  

## 2022-02-12 ENCOUNTER — Other Ambulatory Visit: Payer: Self-pay | Admitting: *Deleted

## 2022-02-12 DIAGNOSIS — M458 Ankylosing spondylitis sacral and sacrococcygeal region: Secondary | ICD-10-CM

## 2022-02-12 LAB — COMPLETE METABOLIC PANEL WITH GFR
AG Ratio: 2 (calc) (ref 1.0–2.5)
ALT: 50 U/L — ABNORMAL HIGH (ref 9–46)
AST: 43 U/L — ABNORMAL HIGH (ref 10–35)
Albumin: 4.4 g/dL (ref 3.6–5.1)
Alkaline phosphatase (APISO): 63 U/L (ref 35–144)
BUN: 16 mg/dL (ref 7–25)
CO2: 30 mmol/L (ref 20–32)
Calcium: 9.5 mg/dL (ref 8.6–10.3)
Chloride: 100 mmol/L (ref 98–110)
Creat: 1.02 mg/dL (ref 0.70–1.30)
Globulin: 2.2 g/dL (calc) (ref 1.9–3.7)
Glucose, Bld: 80 mg/dL (ref 65–99)
Potassium: 4.3 mmol/L (ref 3.5–5.3)
Sodium: 139 mmol/L (ref 135–146)
Total Bilirubin: 0.4 mg/dL (ref 0.2–1.2)
Total Protein: 6.6 g/dL (ref 6.1–8.1)
eGFR: 89 mL/min/{1.73_m2} (ref >=60)

## 2022-02-12 LAB — CBC WITH DIFFERENTIAL/PLATELET
Absolute Monocytes: 937 cells/uL (ref 200–950)
Basophils Absolute: 43 cells/uL (ref 0–200)
Basophils Relative: 0.6 %
Eosinophils Absolute: 107 cells/uL (ref 15–500)
Eosinophils Relative: 1.5 %
HCT: 45 % (ref 38.5–50.0)
Hemoglobin: 15.7 g/dL (ref 13.2–17.1)
Lymphs Abs: 1257 cells/uL (ref 850–3900)
MCH: 33.3 pg — ABNORMAL HIGH (ref 27.0–33.0)
MCHC: 34.9 g/dL (ref 32.0–36.0)
MCV: 95.3 fL (ref 80.0–100.0)
MPV: 9.3 fL (ref 7.5–12.5)
Monocytes Relative: 13.2 %
Neutro Abs: 4757 cells/uL (ref 1500–7800)
Neutrophils Relative %: 67 %
Platelets: 248 10*3/uL (ref 140–400)
RBC: 4.72 10*6/uL (ref 4.20–5.80)
RDW: 12.3 % (ref 11.0–15.0)
Total Lymphocyte: 17.7 %
WBC: 7.1 10*3/uL (ref 3.8–10.8)

## 2022-02-12 MED ORDER — TALTZ 80 MG/ML ~~LOC~~ SOAJ: 80.0000 mg | SUBCUTANEOUS | 2 refills | Status: DC

## 2022-02-12 NOTE — Progress Notes (Signed)
CBC is normal, CMP is normal except elevated LFTs.  Please advise patient to avoid all NSAIDs, and alcohol intake.  His medication list to include Celebrex.  We will continue to monitor labs.

## 2022-02-12 NOTE — Telephone Encounter (Signed)
Patient called the office to review lab results and states he needs a refill on Taltz.   Next Visit: 07/29/2022  Last Visit: 02/11/2022  Last Fill: 10/29/2021  DX:Ankylosing spondylitis of multiple sites in spine   Current Dose per office note 02/11/2022: Taltz 80 mg sq injections every 28 days  Labs: 02/11/2022 CBC is normal, CMP is normal except elevated LFTs.   TB Gold: 08/14/2021 Neg    Okay to refill Taltz?

## 2022-05-01 ENCOUNTER — Encounter: Payer: Self-pay | Admitting: *Deleted

## 2022-05-01 ENCOUNTER — Other Ambulatory Visit: Payer: Self-pay | Admitting: Physician Assistant

## 2022-05-01 DIAGNOSIS — M458 Ankylosing spondylitis sacral and sacrococcygeal region: Secondary | ICD-10-CM

## 2022-05-01 NOTE — Telephone Encounter (Signed)
Next Visit: 07/29/2022   Last Visit: 02/11/2022   Last Fill: 02/12/2022   DX:Ankylosing spondylitis of multiple sites in spine    Current Dose per office note 02/11/2022: Taltz 80 mg sq injections every 28 days   Labs: 02/11/2022 CBC is normal, CMP is normal except elevated LFTs.    TB Gold: 08/14/2021 Neg    Patient reminded via my chart he is due to update labs this month.    Okay to refill Taltz?

## 2022-07-16 NOTE — Progress Notes (Addendum)
Office Visit Note  Patient: Jon Henderson             Date of Birth: February 16, 1971           MRN: [id number]             PCP: Everardo Beals, NP Referring: Everardo Beals, NP Visit Date: 07/29/2022 Occupation: _0 @  Subjective:  Pain in multiple joints   History of Present Illness: Jon Henderson is a 51 y.o. male with history of ankylosing spondylitis and DDD. Patient remains on Taltz 80 mg sq injections every 28 days.  He continues to tolerate Taltz without any side effects or injection site reactions.  Patient reports that since October he has been experiencing recurrent flares.  He states that at the beginning of October he started exercising doing isometric exercises as well as resistive muscle strengthening.  He tried taking Celebrex for 3 to 3-1/2 weeks but had no response.  He resumed taking ibuprofen 400 mg twice daily as needed for pain relief.  He states that his symptoms did not improve with exercise so he backed off the exercise regimen in November.  He states that some mornings it is typical for him to get out of bed due to the severity of pain and stiffness.  He states that his joint pain and stiffness improved by the afternoon.  Patient reports that at the beginning of December he took a prednisone taper and then yesterday started a new prednisone taper.  He states yesterday he take prednisone 40 mg and today he is feeling much better.  Overall he has not found Taltz to be as effective at managing his symptoms as previous medications.       Activities of Daily Living:  Patient reports morning stiffness for 2-3 hours.   Patient Reports nocturnal pain.  Difficulty dressing/grooming: Reports Difficulty climbing stairs: Reports Difficulty getting out of chair: Reports Difficulty using hands for taps, buttons, cutlery, and/or writing: Reports  Review of Systems  Constitutional:  Positive for fatigue.  HENT:  Negative for mouth sores and mouth dryness.   Eyes:   Negative for dryness.  Respiratory:  Negative for shortness of breath.   Cardiovascular:  Negative for chest pain and palpitations.  Gastrointestinal:  Negative for blood in stool, constipation and diarrhea.  Endocrine: Negative for increased urination.  Genitourinary:  Negative for involuntary urination.  Musculoskeletal:  Positive for joint pain, gait problem, joint pain, myalgias, morning stiffness and myalgias. Negative for joint swelling, muscle weakness and muscle tenderness.  Skin:  Negative for color change, rash and sensitivity to sunlight.  Allergic/Immunologic: Negative for susceptible to infections.  Neurological:  Positive for dizziness and numbness. Negative for headaches.  Hematological:  Negative for swollen glands.  Psychiatric/Behavioral:  Positive for depressed mood and sleep disturbance. The patient is nervous/anxious.     PMFS History:  Patient Active Problem List   Diagnosis Date Noted   Ankylosing spondylitis ([place]) 06/12/2016   Sacroiliitis ([place]) 06/12/2016   High risk medication use 06/12/2016   ABNORMAL HEART RHYTHMS 04/20/2008   DDD (degenerative disc disease), lumbar 04/20/2008   COUGH 04/20/2008    Past Medical History:  Diagnosis Date   Ankylosing spondylitis ([employer])    Disc disorder of lumbar region     Family History  Problem Relation Age of Onset   Ankylosing spondylitis Mother    COPD Father    Drug abuse Sister    Alcohol abuse Sister    Stroke Maternal Grandfather  Healthy Daughter    ADD / ADHD Daughter    Healthy Daughter    ADD / ADHD Daughter    Healthy Daughter    Past Surgical History:  Procedure Laterality Date   LUMBAR DISC SURGERY     Social History   Social History Narrative   Not on file   Immunization History  Administered Date(s) Administered   Influenza-Unspecified 03/23/2018   PFIZER(Purple Top)SARS-COV-2 Vaccination 08/29/2019, 09/19/2019     Objective: Vital Signs: BP 132/76 (BP Location: Left Arm, Patient  Position: Sitting, Cuff Size: Normal)   Pulse 72   Resp 17   Ht 6' (1.829 m)   Wt 204 lb 12.8 oz (92.9 kg)   BMI 27.78 kg/m    Physical Exam Vitals and nursing note reviewed.  Constitutional:      Appearance: He is well-developed.  HENT:     Head: Normocephalic and atraumatic.  Eyes:     Conjunctiva/sclera: Conjunctivae normal.     Pupils: Pupils are equal, round, and reactive to light.  Cardiovascular:     Rate and Rhythm: Normal rate and regular rhythm.     Heart sounds: Normal heart sounds.  Pulmonary:     Effort: Pulmonary effort is normal.     Breath sounds: Normal breath sounds.  Abdominal:     General: Bowel sounds are normal.     Palpations: Abdomen is soft.  Musculoskeletal:     Cervical back: Normal range of motion and neck supple.  Skin:    General: Skin is warm and dry.     Capillary Refill: Capillary refill takes less than 2 seconds.  Neurological:     Mental Status: He is alert and oriented to person, place, and time.  Psychiatric:        Behavior: Behavior normal.      Musculoskeletal Exam: C-spine, thoracic spine, and lumbar spine good ROM.  No midline spinal tenderness.  Tenderness over the right SI joint.  Shoulder joints, elbow joints, wrist joints, MCPs, PIPs, DIPs have good range of motion and no synovitis.  Complete fist formation bilaterally.  Hip joints have good range of motion pain.  Knee joints have good range of motion with no warmth or effusion.  Ankle joints have good range of motion with no tenderness or joint swelling.  No evidence of Achilles tendinitis.  CDAI Exam: CDAI Score: -- Patient Global: --; Provider Global: -- Swollen: --; Tender: -- Joint Exam 07/29/2022   No joint exam has been documented for this visit   There is currently no information documented on the homunculus. Go to the Rheumatology activity and complete the homunculus joint exam.  Investigation: No additional findings.  Imaging: No results found.  Recent  Labs: Lab Results  Component Value Date   WBC 7.1 02/11/2022   HGB 15.7 02/11/2022   PLT 248 02/11/2022   NA 139 02/11/2022   K 4.3 02/11/2022   CL 100 02/11/2022   CO2 30 02/11/2022   GLUCOSE 80 02/11/2022   BUN 16 02/11/2022   CREATININE 1.02 02/11/2022   BILITOT 0.4 02/11/2022   ALKPHOS 64 07/16/2018   AST 43 (H) 02/11/2022   ALT 50 (H) 02/11/2022   PROT 6.6 02/11/2022   ALBUMIN 4.3 07/16/2018   CALCIUM 9.5 02/11/2022   GFRAA 94 11/19/2020   QFTBGOLDPLUS NEGATIVE 08/14/2021    Speciality Comments: No specialty comments available.  Procedures:  Sacroiliac Joint Inj on 07/29/2022 3:58 PM Indications: pain Details: 27 G 1.5 in needle, posterior approach Medications: 1  mL lidocaine 1 %; 40 mg triamcinolone acetonide 40 MG/ML Aspirate: 0 mL Outcome: tolerated well, no immediate complications Procedure, treatment alternatives, risks and benefits explained, specific risks discussed. Consent was given by the patient. Immediately prior to procedure a time out was called to verify the correct patient, procedure, equipment, support staff and site/side marked as required. Patient was prepped and draped in the usual sterile fashion.     Allergies: Patient has no known allergies.     Assessment / Plan:     Visit Diagnoses: Ankylosing spondylitis of multiple sites in spine [facility]) -Patient presents today experiencing increased arthralgias and joint stiffness involving multiple joints.  His pain has been most severe in the right SI joint.  He has had morning stiffness lasting into the afternoon on a daily basis since October 2023.  He remains on Taltz 80 mg subcutaneous injections once every 28 days.  He has not missed any doses of Taltz recently.  He tried increasing his activity level by reintroducing muscle strengthening and isometric exercises but his symptoms did not improve.  He has backed off his exercise regimen and has been taking ibuprofen 400 mg twice daily as needed for  symptomatic relief.  He took a prednisone taper in early December and then another taper starting yesterday to alleviate his symptoms-yesterday he took 40 mg of prednisone.  No synovitis or dactylitis was noted on examination today.  His symptoms have been responsive to prednisone use.  Discussed that I would recommend updating ESR and CRP in the future when he is not taking prednisone or NSAIDs to try to assess for active disease.  Overall he has not found Taltz to be as effective at managing his symptoms.  Discussed adding on sulfasalazine as combination therapy.  Indications, contraindications, potential side effects of sulfasalazine were discussed today in detail.  All questions were addressed and consent was obtained.  G6PD will be checked today along with CBC, CMP, TB Gold.  X-rays of the pelvis were obtained today for further evaluation.  For treatment of his current symptoms a right SI joint cortisone injection was performed.  He was also given a sample of Salonpas patch which he can apply topically for pain relief in the future. He will remain on Taltz and will be adding on sulfasalazine as combination therapy.  He will return for lab work in 1 month and every 3 months.  Future orders for sed rate and CRP were placed today.  He will follow-up in the office in 6 to 8 weeks to assess his response.  Plan: Sedimentation rate, C-reactive protein  Medication counseling:  Baseline Immunosuppressant Labs TB GOLD    Latest Ref Rng & Units 08/14/2021    3:10 PM  Quantiferon TB Gold  Quantiferon TB Gold Plus NEGATIVE NEGATIVE    HIV Lab Results  Component Value Date   HIV SEE NOTE 01/07/2017   Immunoglobulins    Latest Ref Rng & Units 01/07/2017    2:49 PM  Immunoglobulin Electrophoresis  IgG 694 - 1618 mg/dL SEE NOTE   IgM 48 - 271 mg/dL SEE NOTE    SPEP    Latest Ref Rng & Units 02/11/2022    2:59 PM  Serum Protein Electrophoresis  Total Protein 6.1 - 8.1 g/dL 6.6    G6PD: Pending   Chest x-ray: no active cardiopulmonary disease on 04/20/08   Does the patient have an allergy to sulfa drugs? No  Patient was counseled on the purpose, proper use, and adverse effects of  sulfasalazine including risk of infection and chance of nausea, headache, and sun sensitivity.  Also discussed risk of skin rash and advised patient to stop the medication and let us know if she develops a rash. Also discussed for the potential of discoloration of the urine, sweat, or tears.  Advised patient to avoid live vaccines.  Recommend annual influenza, Pneumovax 23, Prevnar 13, and Shingrix as indicated.   Reviewed the importance of frequent labs to monitor liver, kidneys, and blood counts.  Standing orders placed and patient to return 1 month after starting therapy and then every 3 months.  Provided patient with educational materials on sulfasalazine and answered all questions.  Patient consented to sulfasalazine use, and consent will be uploaded into the media tab.    Patient dose will be 500 mg 2 tablets twice daily.  Prescription will be sent to pharmacy pending lab results and insurance approval.  High risk medication use - Taltz 80 mg sq injections every 28 days.  Adding on Sulfasalazine 500 mg 2 tablets by mouth twice daily pending lab results.  He will be due to update lab work in 1 month then every 3 months.  Previously had inadequate response to Cosentyx, Humira, and Enbrel. CBC and CMP drawn on 02/11/22. Orders for CBC and CMP released today.  TB gold negative on 08/14/21.  Order for TB gold released today.  Order for G6PD released.  Discussed the importance of holding taltz and sulfasalazine if he develops signs or symptoms of an infection and to resume once the infection has completely cleared.   - Plan: COMPLETE METABOLIC PANEL WITH GFR, CBC with Differential/Platelet, QuantiFERON-TB Gold Plus  Screening for tuberculosis -Order for TB gold released today.  Plan: QuantiFERON-TB Gold  Plus  Chronic right SI joint pain - Patient presents today with right SI joint pain and stiffness.  He has been experiencing recurrent flares in the right SI joint and has had surrounding muscular tension and tenderness with muscle spasms intermittently since October 2023.  His lower back pain and stiffness has not improved with stretching and strengthening exercises.  His symptoms are exacerbated by driving in the car for prolonged periods of time.  He has tried taking Celebrex for 3 to 3-1/2 weeks with no improvement.  He has since transition back to taking ibuprofen 400 mg twice daily as needed for pain relief.  In early December and starting yesterday he was prescribed a prednisone taper which has alleviated his symptoms.  On examination today he has tenderness over the right SI joint.  X-rays of the pelvis were obtained today for further evaluation.  Different treatment options were discussed including trying a right SI joint cortisone injection.  He tolerated the procedure well.  Procedure note was completed above.  Aftercare was discussed.  He will also be adding on sulfasalazine as combination therapy with Taltz.   He was given a sample Salonpas pain relief patch which she can apply topically for pain relief.  Plan: XR Pelvis 1-2 Views  Sacroiliitis ([place]) - He has been experiencing increased pain and stiffness in both SI joints, right > left.  X-rays of the pelvis were obtained today for further evaluation.  The right SI joint was injected with cortisone after informed consent was provided.  He tolerated the procedure well.  He was advised to notify us if hhis symptoms persist or worsen. Plan: XR Pelvis 1-2 Views  DDD (degenerative disc disease), cervical: C-spine has good ROM with no discomfort.    DDD (degenerative disc  disease), thoracic: No midline spinal tenderness currently.    DDD (degenerative disc disease), lumbar: No midline spinal tenderness.   Chronic right shoulder pain: He has  good range of motion of the right shoulder joint on examination today.  Other medical conditions are listed as follows:   History of gastroesophageal reflux (GERD)  Other fatigue  Neuropathy: Patient plans on following back up with his neurologist for further evaluation.   Orders: Orders Placed This Encounter  Procedures   Sacroiliac Joint Inj   XR Pelvis 1-2 Views   COMPLETE METABOLIC PANEL WITH GFR   CBC with Differential/Platelet   QuantiFERON-TB Gold Plus   Sedimentation rate   C-reactive protein   Glucose 6 phosphate dehydrogenase   Meds ordered this encounter  Medications   Ixekizumab (TALTZ) 80 MG/ML SOAJ    Sig: INJECT 80MG SUBCUTANEOUSLY EVERY 4 WEEKS    Dispense:  1 mL    Refill:  2     Follow-Up Instructions: Return in about 6 weeks (around 09/09/2022) for Ankylosing Spondylitis, DDD.   Ofilia Neas, PA-C  Note - This record has been created using Dragon software.  Chart creation errors have been sought, but may not always  have been located. Such creation errors do not reflect on  the standard of medical care.

## 2022-07-29 ENCOUNTER — Ambulatory Visit: Payer: BC Managed Care – PPO | Attending: Physician Assistant | Admitting: Physician Assistant

## 2022-07-29 ENCOUNTER — Other Ambulatory Visit: Payer: Self-pay

## 2022-07-29 ENCOUNTER — Encounter: Payer: Self-pay | Admitting: Physician Assistant

## 2022-07-29 ENCOUNTER — Ambulatory Visit (INDEPENDENT_AMBULATORY_CARE_PROVIDER_SITE_OTHER): Payer: BC Managed Care – PPO

## 2022-07-29 ENCOUNTER — Other Ambulatory Visit: Payer: Self-pay | Admitting: Rheumatology

## 2022-07-29 VITALS — BP 132/76 | HR 72 | Resp 17 | Ht 72.0 in | Wt 204.8 lb

## 2022-07-29 DIAGNOSIS — G8929 Other chronic pain: Secondary | ICD-10-CM

## 2022-07-29 DIAGNOSIS — M450 Ankylosing spondylitis of multiple sites in spine: Secondary | ICD-10-CM

## 2022-07-29 DIAGNOSIS — M533 Sacrococcygeal disorders, not elsewhere classified: Secondary | ICD-10-CM

## 2022-07-29 DIAGNOSIS — G629 Polyneuropathy, unspecified: Secondary | ICD-10-CM

## 2022-07-29 DIAGNOSIS — Z79899 Other long term (current) drug therapy: Secondary | ICD-10-CM | POA: Diagnosis not present

## 2022-07-29 DIAGNOSIS — M5030 Other cervical disc degeneration, unspecified cervical region: Secondary | ICD-10-CM | POA: Diagnosis not present

## 2022-07-29 DIAGNOSIS — M458 Ankylosing spondylitis sacral and sacrococcygeal region: Secondary | ICD-10-CM

## 2022-07-29 DIAGNOSIS — M5134 Other intervertebral disc degeneration, thoracic region: Secondary | ICD-10-CM

## 2022-07-29 DIAGNOSIS — M25511 Pain in right shoulder: Secondary | ICD-10-CM

## 2022-07-29 DIAGNOSIS — Z111 Encounter for screening for respiratory tuberculosis: Secondary | ICD-10-CM | POA: Diagnosis not present

## 2022-07-29 DIAGNOSIS — M461 Sacroiliitis, not elsewhere classified: Secondary | ICD-10-CM

## 2022-07-29 DIAGNOSIS — M5136 Other intervertebral disc degeneration, lumbar region: Secondary | ICD-10-CM

## 2022-07-29 DIAGNOSIS — M51369 Other intervertebral disc degeneration, lumbar region without mention of lumbar back pain or lower extremity pain: Secondary | ICD-10-CM

## 2022-07-29 DIAGNOSIS — Z8719 Personal history of other diseases of the digestive system: Secondary | ICD-10-CM

## 2022-07-29 DIAGNOSIS — R5383 Other fatigue: Secondary | ICD-10-CM

## 2022-07-29 MED ORDER — LIDOCAINE HCL 1 % IJ SOLN
1.0000 mL | INTRAMUSCULAR | Status: AC | PRN
  Administered 2022-07-29: 1 mL

## 2022-07-29 MED ORDER — TALTZ 80 MG/ML ~~LOC~~ SOAJ: SUBCUTANEOUS | 2 refills | Status: DC

## 2022-07-29 MED ORDER — TRIAMCINOLONE ACETONIDE 40 MG/ML IJ SUSP
40.0000 mg | INTRAMUSCULAR | Status: AC | PRN
  Administered 2022-07-29: 40 mg via INTRA_ARTICULAR

## 2022-07-29 NOTE — Patient Instructions (Signed)
Standing Labs We placed an order today for your standing lab work.   Please have your standing labs drawn in 1 month after starting sulfasalazine and then every 3 months.  Please have your labs drawn 2 weeks prior to your appointment so that the provider can discuss your lab results at your appointment.  Please note that you may see your imaging and lab results in [facility] before we have reviewed them. We will contact you once all results are reviewed. Please allow our office up to 72 hours to thoroughly review all of the results before contacting the office for clarification of your results.  Lab hours are:   Monday through Thursday from 8:00 am -12:30 pm and 1:00 pm-5:00 pm and Friday from 8:00 am-12:00 pm.  Please be advised, all patients with office appointments requiring lab work will take precedent over walk-in lab work.   Labs are drawn by [facility]. Please bring your co-pay at the time of your lab draw.  You may receive a bill from [facility] for your lab work.  Please note if you are on Hydroxychloroquine and and an order has been placed for a Hydroxychloroquine level, you will need to have it drawn 4 hours or more after your last dose.  If you wish to have your labs drawn at another location, please call the office 24 hours in advance so we can fax the orders.  The office is located at [address], [place], [place], [place] [address] No appointment is necessary.    If you have any questions regarding directions or hours of operation,  please call [phone].   As a reminder, please drink plenty of water prior to coming for your lab work. Thanks!    Sulfasalazine Tablets What is this medication? SULFASALAZINE (sul fa SAL a zeen) treats ulcerative colitis. It works by decreasing inflammation. It belongs to a group of medications called salicylates. This medicine may be used for other purposes; ask your health care provider or pharmacist if you have questions. COMMON BRAND  NAME(S): Azulfidine, Sulfazine What should I tell my care team before I take this medication? They need to know if you have any of these conditions: Asthma Blood disorders or anemia Glucose-6-phosphate dehydrogenase (G6PD) deficiency Intestinal obstruction Kidney disease Liver disease Porphyria Urinary tract obstruction An unusual reaction to sulfasalazine, sulfa medications, salicylates, or other medications, foods, dyes, or preservatives Pregnant or trying to get pregnant Breast-feeding How should I use this medication? Take this medication by mouth with a full glass of water. Take it as directed on the prescription label at the same time every day. You can take it with or without food. If it upsets your stomach, take it with food. Keep taking it unless your care team tells you to stop. Talk to your care team about the use of this medication in children. While this medication may be prescribed for children as young as 6 years for selected conditions, precautions do apply. Patients over 20 years old may have a stronger reaction and need a smaller dose. Overdosage: If you think you have taken too much of this medicine contact a poison control center or emergency room at once. NOTE: This medicine is only for you. Do not share this medicine with others. What if I miss a dose? If you miss a dose, take it as soon as you can. If it is almost time for your next dose, take only that dose. Do not take double or extra doses. What may interact with this  medication? Digoxin Folic acid This list may not describe all possible interactions. Give your health care provider a list of all the medicines, herbs, non-prescription drugs, or dietary supplements you use. Also tell them if you smoke, drink alcohol, or use illegal drugs. Some items may interact with your medicine. What should I watch for while using this medication? Visit your care team for regular checks on your progress. Tell your care team if  your symptoms do not start to get better or if they get worse. You will need frequent blood and urine checks. This medication can make you more sensitive to the sun. Keep out of the sun. If you cannot avoid being in the sun, wear protective clothing and use sunscreen. Do not use sun lamps or tanning beds/booths. Drink plenty of water while taking this medication. What side effects may I notice from receiving this medication? Side effects that you should report to your care team as soon as possible: Allergic reactions--skin rash, itching, hives, swelling of the face, lips, tongue, or throat Aplastic anemia--unusual weakness or fatigue, dizziness, headache, trouble breathing, increased bleeding or bruising Dry cough, shortness of breath or trouble breathing Heart muscle inflammation--unusual weakness or fatigue, shortness of breath, chest pain, fast or irregular heartbeat, dizziness, swelling of the ankles, feet, or hands Infection--fever, chills, cough, sore throat, wounds that don't heal, pain or trouble when passing urine, general feeling of discomfort or being unwell Kidney injury--decrease in the amount of urine, swelling of the ankles, hands, or feet Liver injury--right upper belly pain, loss of appetite, nausea, light-colored stool, dark yellow or brown urine, yellowing skin or eyes, unusual weakness or fatigue Rash, fever, and swollen lymph nodes Redness, blistering, peeling, or loosening of the skin, including inside the mouth Side effects that usually do not require medical attention (report to your care team if they continue or are bothersome): Dark yellow or orange saliva, sweat, or urine Dizziness Headache Loss of appetite Nausea Upset stomach Vomiting This list may not describe all possible side effects. Call your doctor for medical advice about side effects. You may report side effects to FDA at 1-800-FDA-1088. Where should I keep my medication? Keep out of the reach of children  and pets. Store at room temperature between 15 and 30 degrees C (59 and 86 degrees F). Get rid of any unused medication after the expiration date. To get rid of medications that are no longer needed or have expired: Take the medications to a medication take-back program. Check with your pharmacy or law enforcement to find a location. If you cannot return the medication, check the label or package insert to see if the medication should be thrown out in the garbage or flushed down the toilet. If you are not sure, ask your care team. If it is safe to put it in the trash, take the medication out of the container. Mix the medication with cat litter, dirt, coffee grounds, or other unwanted substance. Seal the mixture in a bag or container. Put it in the trash. NOTE: This sheet is a summary. It may not cover all possible information. If you have questions about this medicine, talk to your doctor, pharmacist, or health care provider.  2023 Elsevier/Gold Standard (2021-04-22 00:00:00)

## 2022-07-29 NOTE — Telephone Encounter (Signed)
Pending G6PD lab result from today, patient will be starting sulfasalazine per Hazel Sams, PA-C. Thanks!   Consent obtained and sent to the scan center.

## 2022-07-30 NOTE — Progress Notes (Signed)
CBC and CMP WNL

## 2022-07-31 LAB — CBC WITH DIFFERENTIAL/PLATELET
Absolute Monocytes: 713 cells/uL (ref 200–950)
Basophils Absolute: 31 cells/uL (ref 0–200)
Basophils Relative: 0.5 %
Eosinophils Absolute: 37 cells/uL (ref 15–500)
Eosinophils Relative: 0.6 %
HCT: 43.4 % (ref 38.5–50.0)
Hemoglobin: 15.1 g/dL (ref 13.2–17.1)
Lymphs Abs: 1252 cells/uL (ref 850–3900)
MCH: 32.5 pg (ref 27.0–33.0)
MCHC: 34.8 g/dL (ref 32.0–36.0)
MCV: 93.5 fL (ref 80.0–100.0)
MPV: 10.3 fL (ref 7.5–12.5)
Monocytes Relative: 11.5 %
Neutro Abs: 4166 cells/uL (ref 1500–7800)
Neutrophils Relative %: 67.2 %
Platelets: 236 10*3/uL (ref 140–400)
RBC: 4.64 10*6/uL (ref 4.20–5.80)
RDW: 12.6 % (ref 11.0–15.0)
Total Lymphocyte: 20.2 %
WBC: 6.2 10*3/uL (ref 3.8–10.8)

## 2022-07-31 LAB — QUANTIFERON-TB GOLD PLUS
Mitogen-NIL: >10 IU/mL
NIL: 0.03 IU/mL
QuantiFERON-TB Gold Plus: NEGATIVE
TB1-NIL: 0.01 IU/mL
TB2-NIL: 0.01 IU/mL

## 2022-07-31 LAB — COMPLETE METABOLIC PANEL WITH GFR
AG Ratio: 1.8 (calc) (ref 1.0–2.5)
ALT: 21 U/L (ref 9–46)
AST: 24 U/L (ref 10–35)
Albumin: 4.1 g/dL (ref 3.6–5.1)
Alkaline phosphatase (APISO): 53 U/L (ref 35–144)
BUN: 13 mg/dL (ref 7–25)
CO2: 25 mmol/L (ref 20–32)
Calcium: 8.8 mg/dL (ref 8.6–10.3)
Chloride: 104 mmol/L (ref 98–110)
Creat: 0.94 mg/dL (ref 0.70–1.30)
Globulin: 2.3 g/dL (calc) (ref 1.9–3.7)
Glucose, Bld: 84 mg/dL (ref 65–99)
Potassium: 3.8 mmol/L (ref 3.5–5.3)
Sodium: 140 mmol/L (ref 135–146)
Total Bilirubin: 0.4 mg/dL (ref 0.2–1.2)
Total Protein: 6.4 g/dL (ref 6.1–8.1)
eGFR: 98 mL/min/{1.73_m2} (ref >=60)

## 2022-07-31 LAB — GLUCOSE 6 PHOSPHATE DEHYDROGENASE: G-6PDH: 13.1 U/g Hgb (ref 7.0–20.5)

## 2022-07-31 NOTE — Progress Notes (Signed)
TB gold negative

## 2022-08-01 MED ORDER — SULFASALAZINE 500 MG PO TABS: 1000.0000 mg | ORAL_TABLET | Freq: Two times a day (BID) | ORAL | 2 refills | Status: DC

## 2022-08-01 NOTE — Telephone Encounter (Signed)
Patient dose will be 500 mg 2 tablets twice daily.

## 2022-08-01 NOTE — Telephone Encounter (Signed)
-----   Message from Ofilia Neas, PA-C sent at 08/01/2022  9:11 AM EST ----- G6PDH WNL.  Ok to initiate sulfasalazine

## 2022-08-01 NOTE — Progress Notes (Signed)
G6PDH WNL. Ok to initiate sulfasalazine.

## 2022-08-22 ENCOUNTER — Telehealth: Payer: Self-pay | Admitting: Pharmacist

## 2022-08-22 NOTE — Telephone Encounter (Signed)
Submitted a Prior Authorization RENEWAL request to [facility] for TALTZ via CoverMyMeds. Will update once we receive a response.  Key: [id number]  [place]ox Saliva, PharmD, MPH, BCPS, CPP Clinical Pharmacist (Rheumatology and Pulmonology)

## 2022-08-26 NOTE — Progress Notes (Deleted)
Office Visit Note  Patient: Jon Henderson             Date of Birth: 04-Dec-1970           MRN: [id number]             PCP: Everardo Beals, NP Referring: Everardo Beals, NP Visit Date: 09/09/2022 Occupation: @GUAROCC$ @  Subjective:  Medication monitoring   History of Present Illness: Jon Henderson is a 52 y.o. male with history ankylosing spondylitis and DDD.  Patient remains on Taltz 80 mg sq injections q28 day and Sulfasalazine 500 mg 2 tablets by mouth twice daily.    CBC and CMP WNL on 07/29/22. His next lab work will be due in April and every 3 months.  TB gold negative on 07/29/22.  Discussed the importance of holding taltz and sulfasalazine if he develops signs or symptoms of an infection and to resume once the infection has completely cleared.    Activities of Daily Living:  Patient reports morning stiffness for *** {minute/hour:19697}.   Patient {ACTIONS;DENIES/REPORTS:21021675::"Denies"} nocturnal pain.  Difficulty dressing/grooming: {ACTIONS;DENIES/REPORTS:21021675::"Denies"} Difficulty climbing stairs: {ACTIONS;DENIES/REPORTS:21021675::"Denies"} Difficulty getting out of chair: {ACTIONS;DENIES/REPORTS:21021675::"Denies"} Difficulty using hands for taps, buttons, cutlery, and/or writing: {ACTIONS;DENIES/REPORTS:21021675::"Denies"}  No Rheumatology ROS completed.   PMFS History:  Patient Active Problem List   Diagnosis Date Noted   Ankylosing spondylitis ([place]) 06/12/2016   Sacroiliitis ([place]) 06/12/2016   High risk medication use 06/12/2016   ABNORMAL HEART RHYTHMS 04/20/2008   DDD (degenerative disc disease), lumbar 04/20/2008   COUGH 04/20/2008    Past Medical History:  Diagnosis Date   Ankylosing spondylitis ([place])    Disc disorder of lumbar region     Family History  Problem Relation Age of Onset   Ankylosing spondylitis Mother    COPD Father    Drug abuse Sister    Alcohol abuse Sister    Stroke Maternal Grandfather    Healthy Daughter    ADD  / ADHD Daughter    Healthy Daughter    ADD / ADHD Daughter    Healthy Daughter    Past Surgical History:  Procedure Laterality Date   LUMBAR DISC SURGERY     Social History   Social History Narrative   Not on file   Immunization History  Administered Date(s) Administered   Influenza-Unspecified 03/23/2018   PFIZER(Purple Top)SARS-COV-2 Vaccination 08/29/2019, 09/19/2019     Objective: Vital Signs: There were no vitals taken for this visit.   Physical Exam Vitals and nursing note reviewed.  Constitutional:      Appearance: He is well-developed.  HENT:     Head: Normocephalic and atraumatic.  Eyes:     Conjunctiva/sclera: Conjunctivae normal.     Pupils: Pupils are equal, round, and reactive to light.  Cardiovascular:     Rate and Rhythm: Normal rate and regular rhythm.     Heart sounds: Normal heart sounds.  Pulmonary:     Effort: Pulmonary effort is normal.     Breath sounds: Normal breath sounds.  Abdominal:     General: Bowel sounds are normal.     Palpations: Abdomen is soft.  Musculoskeletal:     Cervical back: Normal range of motion and neck supple.  Skin:    General: Skin is warm and dry.     Capillary Refill: Capillary refill takes less than 2 seconds.  Neurological:     Mental Status: He is alert and oriented to person, place, and time.  Psychiatric:  Behavior: Behavior normal.      Musculoskeletal Exam: ***  CDAI Exam: CDAI Score: -- Patient Global: --; Provider Global: -- Swollen: --; Tender: -- Joint Exam 09/09/2022   No joint exam has been documented for this visit   There is currently no information documented on the homunculus. Go to the Rheumatology activity and complete the homunculus joint exam.  Investigation: No additional findings.  Imaging: XR Pelvis 1-2 Views  Result Date: 07/29/2022 SI joints are completely fused.  No hip joint narrowing was noted. Impression: Bilateral SI joint fusion was noted consistent with  ankylosing spondylitis.   Recent Labs: Lab Results  Component Value Date   WBC 6.2 07/29/2022   HGB 15.1 07/29/2022   PLT 236 07/29/2022   NA 140 07/29/2022   K 3.8 07/29/2022   CL 104 07/29/2022   CO2 25 07/29/2022   GLUCOSE 84 07/29/2022   BUN 13 07/29/2022   CREATININE 0.94 07/29/2022   BILITOT 0.4 07/29/2022   ALKPHOS 64 07/16/2018   AST 24 07/29/2022   ALT 21 07/29/2022   PROT 6.4 07/29/2022   ALBUMIN 4.3 07/16/2018   CALCIUM 8.8 07/29/2022   GFRAA 94 11/19/2020   QFTBGOLDPLUS NEGATIVE 07/29/2022    Speciality Comments: No specialty comments available.  Procedures:  No procedures performed Allergies: Patient has no known allergies.   Assessment / Plan:     Visit Diagnoses: Ankylosing spondylitis of multiple sites in spine ([place])  High risk medication use  Sacroiliitis (H[facility])  DDD (degenerative disc disease), cervical  DDD (degenerative disc disease), thoracic  DDD (degenerative disc disease), lumbar  Chronic right shoulder pain  History of gastroesophageal reflux (GERD)  Other fatigue  Neuropathy  Orders: No orders of the defined types were placed in this encounter.  No orders of the defined types were placed in this encounter.   Face-to-face time spent with patient was *** minutes. Greater than 50% of time was spent in counseling and coordination of care.  Follow-Up Instructions: No follow-ups on file.   Ofilia Neas, PA-C  Note - This record has been created using Dragon software.  Chart creation errors have been sought, but may not always  have been located. Such creation errors do not reflect on  the standard of medical care.

## 2022-08-29 NOTE — Telephone Encounter (Signed)
Received notification from [facility] regarding a prior authorization for [place]. Authorization has been APPROVED from 08/22/2022 to 08/23/2023. Approval letter sent to scan center.  Patient can continue to fill through [facility]: [phone]   Authorization # [id number]  Knox Saliva, PharmD, MPH, BCPS, CPP Clinical Pharmacist (Rheumatology and Pulmonology)

## 2022-09-09 ENCOUNTER — Ambulatory Visit: Payer: BC Managed Care – PPO | Admitting: Physician Assistant

## 2022-09-09 DIAGNOSIS — M5134 Other intervertebral disc degeneration, thoracic region: Secondary | ICD-10-CM

## 2022-09-09 DIAGNOSIS — M5030 Other cervical disc degeneration, unspecified cervical region: Secondary | ICD-10-CM

## 2022-09-09 DIAGNOSIS — M461 Sacroiliitis, not elsewhere classified: Secondary | ICD-10-CM

## 2022-09-09 DIAGNOSIS — Z79899 Other long term (current) drug therapy: Secondary | ICD-10-CM

## 2022-09-09 DIAGNOSIS — G8929 Other chronic pain: Secondary | ICD-10-CM

## 2022-09-09 DIAGNOSIS — M450 Ankylosing spondylitis of multiple sites in spine: Secondary | ICD-10-CM

## 2022-09-09 DIAGNOSIS — Z8719 Personal history of other diseases of the digestive system: Secondary | ICD-10-CM

## 2022-09-09 DIAGNOSIS — M5136 Other intervertebral disc degeneration, lumbar region: Secondary | ICD-10-CM

## 2022-09-09 DIAGNOSIS — G629 Polyneuropathy, unspecified: Secondary | ICD-10-CM

## 2022-09-09 DIAGNOSIS — R5383 Other fatigue: Secondary | ICD-10-CM

## 2022-09-09 NOTE — Progress Notes (Unsigned)
Office Visit Note  Patient: Jon Henderson             Date of Birth: 1970/08/31           MRN: [id number]             PCP: Everardo Beals, NP Referring: Everardo Beals, NP Visit Date: 09/10/2022 Occupation: @GUAROCC$ @  Subjective:  Medication monitoring   History of Present Illness: LEBRANDON HUSKA is a 52 y.o. male with history ankylosing spondylitis.  Patient remains on Taltz 80 mg sq injections every 28 days and Sulfasalazine 500 mg 2 tablets by mouth twice daily.  Sulfasalazine was added after his last office visit on 07/29/22.   CBC and CMP updated on 07/29/22.  Order for CBC and CMP released today.  TB gold negative on 07/29/22  Discussed the importance of holding taltz and sulfasalazine if he develops signs or symptoms of an infection and to resume once the infection has completely cleared.   Activities of Daily Living:  Patient reports morning stiffness for *** {minute/hour:19697}.   Patient {ACTIONS;DENIES/REPORTS:21021675::"Denies"} nocturnal pain.  Difficulty dressing/grooming: {ACTIONS;DENIES/REPORTS:21021675::"Denies"} Difficulty climbing stairs: {ACTIONS;DENIES/REPORTS:21021675::"Denies"} Difficulty getting out of chair: {ACTIONS;DENIES/REPORTS:21021675::"Denies"} Difficulty using hands for taps, buttons, cutlery, and/or writing: {ACTIONS;DENIES/REPORTS:21021675::"Denies"}  No Rheumatology ROS completed.   PMFS History:  Patient Active Problem List   Diagnosis Date Noted   Ankylosing spondylitis ([place]) 06/12/2016   Sacroiliitis ([place]) 06/12/2016   High risk medication use 06/12/2016   ABNORMAL HEART RHYTHMS 04/20/2008   DDD (degenerative disc disease), lumbar 04/20/2008   COUGH 04/20/2008    Past Medical History:  Diagnosis Date   Ankylosing spondylitis ([place])    Disc disorder of lumbar region     Family History  Problem Relation Age of Onset   Ankylosing spondylitis Mother    COPD Father    Drug abuse Sister    Alcohol abuse Sister    Stroke Maternal  Grandfather    Healthy Daughter    ADD / ADHD Daughter    Healthy Daughter    ADD / ADHD Daughter    Healthy Daughter    Past Surgical History:  Procedure Laterality Date   LUMBAR DISC SURGERY     Social History   Social History Narrative   Not on file   Immunization History  Administered Date(s) Administered   Influenza-Unspecified 03/23/2018   PFIZER(Purple Top)SARS-COV-2 Vaccination 08/29/2019, 09/19/2019     Objective: Vital Signs: There were no vitals taken for this visit.   Physical Exam   Musculoskeletal Exam: ***  CDAI Exam: CDAI Score: -- Patient Global: --; Provider Global: -- Swollen: --; Tender: -- Joint Exam 09/10/2022   No joint exam has been documented for this visit   There is currently no information documented on the homunculus. Go to the Rheumatology activity and complete the homunculus joint exam.  Investigation: No additional findings.  Imaging: No results found.  Recent Labs: Lab Results  Component Value Date   WBC 6.2 07/29/2022   HGB 15.1 07/29/2022   PLT 236 07/29/2022   NA 140 07/29/2022   K 3.8 07/29/2022   CL 104 07/29/2022   CO2 25 07/29/2022   GLUCOSE 84 07/29/2022   BUN 13 07/29/2022   CREATININE 0.94 07/29/2022   BILITOT 0.4 07/29/2022   ALKPHOS 64 07/16/2018   AST 24 07/29/2022   ALT 21 07/29/2022   PROT 6.4 07/29/2022   ALBUMIN 4.3 07/16/2018   CALCIUM 8.8 07/29/2022   GFRAA 94 11/19/2020   QFTBGOLDPLUS NEGATIVE 07/29/2022  Speciality Comments: No specialty comments available.  Procedures:  No procedures performed Allergies: Patient has no known allergies.   Assessment / Plan:     Visit Diagnoses: Ankylosing spondylitis of multiple sites in spine ([place])  High risk medication use  Sacroiliitis (HCC)  DDD (degenerative disc disease), cervical  DDD (degenerative disc disease), thoracic  DDD (degenerative disc disease), lumbar  Chronic right shoulder pain  History of gastroesophageal reflux  (GERD)  Other fatigue  Neuropathy  Orders: No orders of the defined types were placed in this encounter.  No orders of the defined types were placed in this encounter.   Face-to-face time spent with patient was *** minutes. Greater than 50% of time was spent in counseling and coordination of care.  Follow-Up Instructions: No follow-ups on file.   Ofilia Neas, PA-C  Note - This record has been created using Dragon software.  Chart creation errors have been sought, but may not always  have been located. Such creation errors do not reflect on  the standard of medical care.

## 2022-09-10 ENCOUNTER — Ambulatory Visit: Payer: BC Managed Care – PPO | Attending: Physician Assistant | Admitting: Physician Assistant

## 2022-09-10 ENCOUNTER — Encounter: Payer: Self-pay | Admitting: Physician Assistant

## 2022-09-10 VITALS — BP 141/92 | HR 88 | Resp 16 | Ht 72.0 in | Wt 203.0 lb

## 2022-09-10 DIAGNOSIS — M5136 Other intervertebral disc degeneration, lumbar region: Secondary | ICD-10-CM

## 2022-09-10 DIAGNOSIS — G8929 Other chronic pain: Secondary | ICD-10-CM

## 2022-09-10 DIAGNOSIS — Z79899 Other long term (current) drug therapy: Secondary | ICD-10-CM

## 2022-09-10 DIAGNOSIS — M533 Sacrococcygeal disorders, not elsewhere classified: Secondary | ICD-10-CM

## 2022-09-10 DIAGNOSIS — R5383 Other fatigue: Secondary | ICD-10-CM

## 2022-09-10 DIAGNOSIS — Z8719 Personal history of other diseases of the digestive system: Secondary | ICD-10-CM

## 2022-09-10 DIAGNOSIS — G629 Polyneuropathy, unspecified: Secondary | ICD-10-CM

## 2022-09-10 DIAGNOSIS — M461 Sacroiliitis, not elsewhere classified: Secondary | ICD-10-CM

## 2022-09-10 DIAGNOSIS — M450 Ankylosing spondylitis of multiple sites in spine: Secondary | ICD-10-CM

## 2022-09-10 DIAGNOSIS — M25511 Pain in right shoulder: Secondary | ICD-10-CM

## 2022-09-10 DIAGNOSIS — M5030 Other cervical disc degeneration, unspecified cervical region: Secondary | ICD-10-CM | POA: Diagnosis not present

## 2022-09-10 DIAGNOSIS — M5134 Other intervertebral disc degeneration, thoracic region: Secondary | ICD-10-CM

## 2022-09-10 NOTE — Patient Instructions (Addendum)
Standing Labs We placed an order today for your standing lab work.   Please have your standing labs drawn in April and every 3 months   Please have your labs drawn 2 weeks prior to your appointment so that the provider can discuss your lab results at your appointment.  Please note that you may see your imaging and lab results in [facility] before we have reviewed them. We will contact you once all results are reviewed. Please allow our office up to 72 hours to thoroughly review all of the results before contacting the office for clarification of your results.  Lab hours are:   Monday through Thursday from 8:00 am -12:30 pm and 1:00 pm-5:00 pm and Friday from 8:00 am-12:00 pm.  Please be advised, all patients with office appointments requiring lab work will take precedent over walk-in lab work.   Labs are drawn by [facility]. Please bring your co-pay at the time of your lab draw.  You may receive a bill from [facility] for your lab work.  Please note if you are on Hydroxychloroquine and and an order has been placed for a Hydroxychloroquine level, you will need to have it drawn 4 hours or more after your last dose.  If you wish to have your labs drawn at another location, please call the office 24 hours in advance so we can fax the orders.  The office is located at [address], [place], [place], [place] [address] No appointment is necessary.    If you have any questions regarding directions or hours of operation,  please call [phone].   As a reminder, please drink plenty of water prior to coming for your lab work. Thanks!  If you have signs or symptoms of an infection or start antibiotics: First, call your PCP for workup of your infection. Hold your medication through the infection, until you complete your antibiotics, and until symptoms resolve if you take the following: Injectable medication (Actemra, Benlysta, Cimzia, Cosentyx, Enbrel, Humira, Kevzara, Orencia, Remicade, Simponi,  Stelara, Taltz, Tremfya) Methotrexate Leflunomide (Arava) Mycophenolate (Cellcept) Morrie Sheldon, Olumiant, or Rinvoq  Vaccines You are taking a medication(s) that can suppress your immune system.  The following immunizations are recommended: Flu annually Covid-19  Td/Tdap (tetanus, diphtheria, pertussis) every 10 years Pneumonia (Prevnar 15 then Pneumovax 23 at least 1 year apart.  Alternatively, can take Prevnar 20 without needing additional dose) Shingrix: 2 doses from 4 weeks to 6 months apart  Please check with your PCP to make sure you are up to date.

## 2022-10-20 ENCOUNTER — Other Ambulatory Visit: Payer: Self-pay | Admitting: Physician Assistant

## 2022-10-20 DIAGNOSIS — M458 Ankylosing spondylitis sacral and sacrococcygeal region: Secondary | ICD-10-CM

## 2022-10-21 ENCOUNTER — Encounter: Payer: Self-pay | Admitting: *Deleted

## 2022-10-21 NOTE — Telephone Encounter (Signed)
Last Fill: 07/29/2022  Labs: 07/29/2022 CBC and CMP WNL   TB Gold: 07/29/2022 Neg    Next Visit: 12/09/2022  Last Visit: 09/10/2022  DX:Ankylosing spondylitis of multiple sites in spine   Current Dose per office note 09/10/2022: Taltz 80 mg sq injections every 28 days.   Okay to refill Taltz?

## 2022-11-26 NOTE — Progress Notes (Unsigned)
Office Visit Note  Patient: Jon Henderson             Date of Birth: 1970-08-11           MRN: [id number]             PCP: [name], NP Referring: [name], NP Visit Date: 12/09/2022 Occupation: @GUAROCC @  Subjective:  Medication monitoring   History of Present Illness: Jon Henderson is a 52 y.o. male with history of ankylosing spondylitis and DDD.  He remains on Taltz 80 mg sq injections every 28 days.  He is due for his next dose of Taltz tomorrow.  Patient states that he has had a change in insurance and requested a sample of Taltz today.  Patient states that about 2 and half weeks ago he has started to follow a strict anti-inflammatory diet.  He states that he has noticed a significant improvement in his inflammation.  He denies any SI joint pain at this time.  He is not experiencing any joint swelling.  He denies any Achilles tendinitis or plantar fasciitis.  He denies any difficulty with ADLs.  He plans on continuing to follow the anti-inflammatory diet since it has provided so much benefit thus far.   Activities of Daily Living:  Patient reports morning stiffness for 15-20 minutes.   Patient Denies nocturnal pain.  Difficulty dressing/grooming: Denies Difficulty climbing stairs: Reports Difficulty getting out of chair: Denies Difficulty using hands for taps, buttons, cutlery, and/or writing: Reports  Review of Systems  Constitutional:  Positive for fatigue.  HENT:  Negative for mouth sores and mouth dryness.   Eyes:  Negative for dryness.  Respiratory:  Positive for shortness of breath.   Cardiovascular:  Positive for chest pain. Negative for palpitations.  Gastrointestinal:  Negative for blood in stool, constipation and diarrhea.  Endocrine: Negative for increased urination.  Genitourinary:  Negative for involuntary urination.  Musculoskeletal:  Positive for joint pain, joint pain, joint swelling, myalgias, muscle weakness, morning stiffness, muscle  tenderness and myalgias. Negative for gait problem.  Skin:  Positive for sensitivity to sunlight. Negative for color change, rash and hair loss.  Allergic/Immunologic: Negative for susceptible to infections.  Neurological:  Negative for dizziness and headaches.  Hematological:  Negative for swollen glands.  Psychiatric/Behavioral:  Positive for sleep disturbance. Negative for depressed mood. The patient is nervous/anxious.     PMFS History:  Patient Active Problem List   Diagnosis Date Noted   Ankylosing spondylitis ([facility]) 06/12/2016   Sacroiliitis ([facility]) 06/12/2016   High risk medication use 06/12/2016   ABNORMAL HEART RHYTHMS 04/20/2008   DDD (degenerative disc disease), lumbar 04/20/2008   COUGH 04/20/2008    Past Medical History:  Diagnosis Date   Ankylosing spondylitis ([facility])    Disc disorder of lumbar region     Family History  Problem Relation Age of Onset   Ankylosing spondylitis Mother    COPD Father    Drug abuse Sister    Alcohol abuse Sister    Stroke Maternal Grandfather    Healthy Daughter    ADD / ADHD Daughter    Healthy Daughter    ADD / ADHD Daughter    Healthy Daughter    Past Surgical History:  Procedure Laterality Date   LUMBAR DISC SURGERY     Social History   Social History Narrative   Not on file   Immunization History  Administered Date(s) Administered   Influenza-Unspecified 03/23/2018   PFIZER(Purple Top)SARS-COV-2 Vaccination 08/29/2019, 09/19/2019  Objective: Vital Signs: BP 124/75 (BP Location: Left Arm, Patient Position: Sitting, Cuff Size: Normal)   Pulse 73   Resp 14   Ht 6' (1.829 m)   Wt 193 lb (87.5 kg)   BMI 26.18 kg/m    Physical Exam Vitals and nursing note reviewed.  Constitutional:      Appearance: He is well-developed.  HENT:     Head: Normocephalic and atraumatic.  Eyes:     Conjunctiva/sclera: Conjunctivae normal.     Pupils: Pupils are equal, round, and reactive to light.  Cardiovascular:     Rate and  Rhythm: Normal rate and regular rhythm.     Heart sounds: Normal heart sounds.  Pulmonary:     Effort: Pulmonary effort is normal.     Breath sounds: Normal breath sounds.  Abdominal:     General: Bowel sounds are normal.     Palpations: Abdomen is soft.  Musculoskeletal:     Cervical back: Normal range of motion and neck supple.  Skin:    General: Skin is warm and dry.     Capillary Refill: Capillary refill takes less than 2 seconds.  Neurological:     Mental Status: He is alert and oriented to person, place, and time.  Psychiatric:        Behavior: Behavior normal.      Musculoskeletal Exam: C-spine has slightly limited range of motion with lateral rotation.  No midline spinal tenderness.  No SI joint tenderness.  Thoracic and lumbar spine have good range of motion with no discomfort.  Shoulder joints, elbow joints, wrist joints, MCPs, PIPs, DIPs have good range of motion with no synovitis.  Complete fist formation bilaterally.  Hip joints have good range of motion with no groin pain.  Knee joints have good range of motion with no warmth or effusion.  Ankle joints have good range of motion with no tenderness or joint swelling.  No evidence of Achilles tendinitis.  CDAI Exam: CDAI Score: -- Patient Global: --; Provider Global: -- Swollen: --; Tender: -- Joint Exam 12/09/2022   No joint exam has been documented for this visit   There is currently no information documented on the homunculus. Go to the Rheumatology activity and complete the homunculus joint exam.  Investigation: No additional findings.  Imaging: No results found.  Recent Labs: Lab Results  Component Value Date   WBC 6.2 07/29/2022   HGB 15.1 07/29/2022   PLT 236 07/29/2022   NA 140 07/29/2022   K 3.8 07/29/2022   CL 104 07/29/2022   CO2 25 07/29/2022   GLUCOSE 84 07/29/2022   BUN 13 07/29/2022   CREATININE 0.94 07/29/2022   BILITOT 0.4 07/29/2022   ALKPHOS 64 07/16/2018   AST 24 07/29/2022   ALT 21  07/29/2022   PROT 6.4 07/29/2022   ALBUMIN 4.3 07/16/2018   CALCIUM 8.8 07/29/2022   GFRAA 94 11/19/2020   QFTBGOLDPLUS NEGATIVE 07/29/2022    Speciality Comments: No specialty comments available.  Procedures:  No procedures performed Allergies: Patient has no known allergies.   Assessment / Plan:     Visit Diagnoses: Ankylosing spondylitis of multiple sites in spine [facility]): He has no synovitis or dactylitis on examination today.  C-spine has slightly limited range of motion with lateral rotation but has good flexion and extension.  Good range of motion of the thoracic and lumbar spine with no midline spinal tenderness.  No SI joint tenderness upon palpation.  He has not been experiencing any nocturnal pain and  has noticed less morning stiffness.  He has no Achilles tendinitis or plantar fasciitis.  He remains on Taltz 80 mg subcutaneous injections every 28 days. He has been following a natural anti-inflammatory diet for the last 2 and half weeks which has provided significant relief of general inflammation.  He plans on continuing to follow this diet to help alleviate his joint pain and joint stiffness.  He will remain on Taltz 80 mg subcutaneous injections every 28 days.  According to the patient he is due for his next dose of Taltz tomorrow.  A sample of Altamease Oiler was provided to the patient today.  He was advised to notify us if he develops any new or worsening symptoms.  He will follow-up in the office in 5 months or sooner if needed.  High risk medication use - Taltz 80 mg sq injections every 28 days. CBC and CMP updated on 07/29/22.  Orders for CBC and CMP released today.  His next lab work will be due in August and every 3 months to monitor for drug toxicity. TB gold negative on 07/29/22. Discussed the importance of holding taltz if he develops any signs or symptoms of an infection and to resume once the infection has completely cleared.  - Plan: CBC with Differential/Platelet, COMPLETE METABOLIC  PANEL WITH GFR  Sacroiliitis (HCC): No SI joint tenderness upon palpation today.  No nocturnal pain or morning stiffness.  DDD (degenerative disc disease), cervical: C-spine has slightly limited range of motion with lateral rotation.  Good flexion extension noted today.  DDD (degenerative disc disease), thoracic: No midline spinal tenderness at this time.  DDD (degenerative disc disease), lumbar: He has no midline spinal tenderness noted on examination today.  He has good range of motion of the lumbar spine with no discomfort.  Chronic right shoulder pain: Resolved.  Good ROM with no discomfort at this time.   Other medical conditions are listed as follows:   History of gastroesophageal reflux (GERD)  Other fatigue  Neuropathy: He plans on scheduling a routine follow-up visit with Dr. Lucia Gaskins.    Orders: Orders Placed This Encounter  Procedures   CBC with Differential/Platelet   COMPLETE METABOLIC PANEL WITH GFR   No orders of the defined types were placed in this encounter.    Follow-Up Instructions: Return in about 5 months (around 05/11/2023) for Ankylosing Spondylitis, DDD.   Gearldine Bienenstock, PA-C  Note - This record has been created using Dragon software.  Chart creation errors have been sought, but may not always  have been located. Such creation errors do not reflect on  the standard of medical care.

## 2022-12-09 ENCOUNTER — Ambulatory Visit: Payer: Self-pay | Attending: Physician Assistant | Admitting: Physician Assistant

## 2022-12-09 ENCOUNTER — Encounter: Payer: Self-pay | Admitting: Physician Assistant

## 2022-12-09 VITALS — BP 124/75 | HR 73 | Resp 14 | Ht 72.0 in | Wt 193.0 lb

## 2022-12-09 DIAGNOSIS — Z8719 Personal history of other diseases of the digestive system: Secondary | ICD-10-CM

## 2022-12-09 DIAGNOSIS — R5383 Other fatigue: Secondary | ICD-10-CM

## 2022-12-09 DIAGNOSIS — M5136 Other intervertebral disc degeneration, lumbar region: Secondary | ICD-10-CM

## 2022-12-09 DIAGNOSIS — M25511 Pain in right shoulder: Secondary | ICD-10-CM

## 2022-12-09 DIAGNOSIS — M450 Ankylosing spondylitis of multiple sites in spine: Secondary | ICD-10-CM

## 2022-12-09 DIAGNOSIS — Z79899 Other long term (current) drug therapy: Secondary | ICD-10-CM

## 2022-12-09 DIAGNOSIS — M5134 Other intervertebral disc degeneration, thoracic region: Secondary | ICD-10-CM

## 2022-12-09 DIAGNOSIS — M533 Sacrococcygeal disorders, not elsewhere classified: Secondary | ICD-10-CM

## 2022-12-09 DIAGNOSIS — G629 Polyneuropathy, unspecified: Secondary | ICD-10-CM

## 2022-12-09 DIAGNOSIS — M461 Sacroiliitis, not elsewhere classified: Secondary | ICD-10-CM

## 2022-12-09 DIAGNOSIS — M5030 Other cervical disc degeneration, unspecified cervical region: Secondary | ICD-10-CM

## 2022-12-09 DIAGNOSIS — G8929 Other chronic pain: Secondary | ICD-10-CM

## 2022-12-09 NOTE — Progress Notes (Signed)
Medication Samples have been provided to the patient.  Drug name: Altamease Oiler       Strength: 80mg         Qty: 1  LOT: [id number]  Exp.Date: 02/16/2024  Dosing instructions: Inject 80mg  into the skin every 4 weeks.

## 2022-12-09 NOTE — Patient Instructions (Addendum)
Standing Labs We placed an order today for your standing lab work.   Please have your standing labs drawn in August and every 3 months he is due for his next dose of  Please have your labs drawn 2 weeks prior to your appointment so that the provider can discuss your lab results at your appointment, if possible.  Please note that you may see your imaging and lab results in MyChart before we have reviewed them. We will contact you once all results are reviewed. Please allow our office up to 72 hours to thoroughly review all of the results before contacting the office for clarification of your results.  WALK-IN LAB HOURS  Monday through Thursday from 8:00 am -12:30 pm and 1:00 pm-5:00 pm and Friday from 8:00 am-12:00 pm.  Patients with office visits requiring labs will be seen before walk-in labs.  You may encounter longer than normal wait times. Please allow additional time. Wait times may be shorter on  Monday and Thursday afternoons.  We do not book appointments for walk-in labs. We appreciate your patience and understanding with our staff.   Labs are drawn by [facility]. Please bring your co-pay at the time of your lab draw.  You may receive a bill from [facility] for your lab work.  Please note if you are on Hydroxychloroquine and and an order has been placed for a Hydroxychloroquine level,  you will need to have it drawn 4 hours or more after your last dose.  If you wish to have your labs drawn at another location, please call the office 24 hours in advance so we can fax the orders.  The office is located at [address], [place], [place] [address]   If you have any questions regarding directions or hours of operation,  please call [phone].   As a reminder, please drink plenty of water prior to coming for your lab work. Thanks!

## 2022-12-10 LAB — CBC WITH DIFFERENTIAL/PLATELET
Absolute Monocytes: 859 cells/uL (ref 200–950)
Basophils Absolute: 28 cells/uL (ref 0–200)
Basophils Relative: 0.4 %
Eosinophils Absolute: 43 cells/uL (ref 15–500)
Eosinophils Relative: 0.6 %
HCT: 43 % (ref 38.5–50.0)
Hemoglobin: 14.7 g/dL (ref 13.2–17.1)
Lymphs Abs: 1015 cells/uL (ref 850–3900)
MCH: 32.7 pg (ref 27.0–33.0)
MCHC: 34.2 g/dL (ref 32.0–36.0)
MCV: 95.8 fL (ref 80.0–100.0)
MPV: 9.7 fL (ref 7.5–12.5)
Monocytes Relative: 12.1 %
Neutro Abs: 5155 cells/uL (ref 1500–7800)
Neutrophils Relative %: 72.6 %
Platelets: 272 10*3/uL (ref 140–400)
RBC: 4.49 10*6/uL (ref 4.20–5.80)
RDW: 12 % (ref 11.0–15.0)
Total Lymphocyte: 14.3 %
WBC: 7.1 10*3/uL (ref 3.8–10.8)

## 2022-12-10 LAB — COMPLETE METABOLIC PANEL WITH GFR
AG Ratio: 1.8 (calc) (ref 1.0–2.5)
ALT: 18 U/L (ref 9–46)
AST: 18 U/L (ref 10–35)
Albumin: 4.2 g/dL (ref 3.6–5.1)
Alkaline phosphatase (APISO): 47 U/L (ref 35–144)
BUN: 12 mg/dL (ref 7–25)
CO2: 28 mmol/L (ref 20–32)
Calcium: 9.3 mg/dL (ref 8.6–10.3)
Chloride: 102 mmol/L (ref 98–110)
Creat: 0.83 mg/dL (ref 0.70–1.30)
Globulin: 2.3 g/dL (calc) (ref 1.9–3.7)
Glucose, Bld: 90 mg/dL (ref 65–99)
Potassium: 4.5 mmol/L (ref 3.5–5.3)
Sodium: 139 mmol/L (ref 135–146)
Total Bilirubin: 0.4 mg/dL (ref 0.2–1.2)
Total Protein: 6.5 g/dL (ref 6.1–8.1)
eGFR: 106 mL/min/{1.73_m2} (ref >=60)

## 2022-12-10 NOTE — Progress Notes (Signed)
CBC and CMP WNL

## 2022-12-31 DIAGNOSIS — D485 Neoplasm of uncertain behavior of skin: Secondary | ICD-10-CM | POA: Diagnosis not present

## 2022-12-31 DIAGNOSIS — L578 Other skin changes due to chronic exposure to nonionizing radiation: Secondary | ICD-10-CM | POA: Diagnosis not present

## 2022-12-31 DIAGNOSIS — L821 Other seborrheic keratosis: Secondary | ICD-10-CM | POA: Diagnosis not present

## 2022-12-31 DIAGNOSIS — D225 Melanocytic nevi of trunk: Secondary | ICD-10-CM | POA: Diagnosis not present

## 2023-01-07 ENCOUNTER — Ambulatory Visit: Payer: BC Managed Care – PPO | Admitting: Cardiology

## 2023-01-07 ENCOUNTER — Encounter: Payer: Self-pay | Admitting: Cardiology

## 2023-01-07 VITALS — BP 113/73 | HR 91 | Resp 16 | Ht 72.0 in | Wt 184.0 lb

## 2023-01-07 DIAGNOSIS — M452 Ankylosing spondylitis of cervical region: Secondary | ICD-10-CM | POA: Diagnosis not present

## 2023-01-07 DIAGNOSIS — R42 Dizziness and giddiness: Secondary | ICD-10-CM

## 2023-01-07 DIAGNOSIS — R0602 Shortness of breath: Secondary | ICD-10-CM

## 2023-01-07 NOTE — Progress Notes (Signed)
Primary Physician/Referring:  [name], NP  Patient ID: Jon Henderson, male    DOB: 07/16/1971, 52 y.o.   MRN: [id number]  Chief Complaint  Patient presents with   Shortness of Breath   Dizziness    With exertion   New Patient (Initial Visit)   HPI:    Jon Henderson  is a 52 y.o. Caucasian male patient with history of ankylosing spondylitis, brachial plexopathy but otherwise no cardiac history presents for evaluation of dizziness and dyspnea, self-referred.  Patient is very active, exercises regularly, until recently over the past 5 to 6 months he has noticed marked decrease in exercise capacity, having shortness of breath, no PND or orthopnea or chest pain.  No palpitations.  He has been losing muscle bulk.  Also states that he sometimes feels he is choking when he is eating and has difficulty swallowing.  No focal neurologic deficits.  Past Medical History:  Diagnosis Date   Ankylosing spondylitis (HCC)    Disc disorder of lumbar region    Past Surgical History:  Procedure Laterality Date   LUMBAR DISC SURGERY     Family History  Problem Relation Age of Onset   Ankylosing spondylitis Mother    COPD Father    Drug abuse Sister    Alcohol abuse Sister    Stroke Maternal Grandfather    Healthy Daughter    ADD / ADHD Daughter    Healthy Daughter    ADD / ADHD Daughter    Healthy Daughter     Social History   Tobacco Use   Smoking status: Former    Packs/day: 1.00    Years: 10.00    Additional pack years: 0.00    Total pack years: 10.00    Types: Cigarettes    Quit date: 06/24/2001    Years since quitting: 21.5    Passive exposure: Never   Smokeless tobacco: Former    Types: Snuff  Substance Use Topics   Alcohol use: Not Currently    Comment: occasionally   Marital Status: Married  ROS   Review of Systems  Cardiovascular:  Positive for dyspnea on exertion. Negative for chest pain and leg swelling.   Objective      01/07/2023    1:40 PM  12/09/2022   12:58 PM 09/10/2022    2:19 PM  Vitals with BMI  Height 6\' 0"  6\' 0"  6\' 0"   Weight 184 lbs 193 lbs 203 lbs  BMI 24.95 26.17 27.53  Systolic 113 124 782  Diastolic 73 75 92  Pulse 91 73 88   Orthostatic VS for the past 72 hrs (Last 3 readings):  Orthostatic BP Patient Position BP Location Cuff Size Orthostatic Pulse  01/07/23 1347 96/73 Standing Left Arm Large 123  01/07/23 1346 100/78 Sitting Left Arm Large 96  01/07/23 1345 116/76 Supine Left Arm Large 80    Blood pressure 113/73, pulse 91, resp. rate 16, height 6' (1.829 m), weight 184 lb (83.5 kg), SpO2 96 %.  Physical Exam Neck:     Vascular: No carotid bruit or JVD.  Cardiovascular:     Rate and Rhythm: Normal rate and regular rhythm.     Pulses: Intact distal pulses.     Heart sounds: Normal heart sounds. No murmur heard.    No gallop.  Pulmonary:     Effort: Pulmonary effort is normal.     Breath sounds: Normal breath sounds.  Abdominal:     General: Bowel sounds are normal.  Palpations: Abdomen is soft.  Musculoskeletal:     Right lower leg: No edema.     Left lower leg: No edema.     Laboratory examination:   Recent Labs    02/11/22 1459 07/29/22 1550 12/09/22 1339  NA 139 140 139  K 4.3 3.8 4.5  CL 100 104 102  CO2 30 25 28   GLUCOSE 80 84 90  BUN 16 13 12   CREATININE 1.02 0.94 0.83  CALCIUM 9.5 8.8 9.3    Lab Results  Component Value Date   GLUCOSE 90 12/09/2022   NA 139 12/09/2022   K 4.5 12/09/2022   CL 102 12/09/2022   CO2 28 12/09/2022   BUN 12 12/09/2022   CREATININE 0.83 12/09/2022   EGFR 106 12/09/2022   CALCIUM 9.3 12/09/2022   PROT 6.5 12/09/2022   ALBUMIN 4.3 07/16/2018   LABGLOB 2.7 07/16/2018   AGRATIO 1.6 07/16/2018   BILITOT 0.4 12/09/2022   ALKPHOS 64 07/16/2018   AST 18 12/09/2022   ALT 18 12/09/2022      Lab Results  Component Value Date   ALT 18 12/09/2022   AST 18 12/09/2022   ALKPHOS 64 07/16/2018   BILITOT 0.4 12/09/2022       Latest Ref  Rng & Units 12/09/2022    1:39 PM 07/29/2022    3:50 PM 02/11/2022    2:59 PM  Hepatic Function  Total Protein 6.1 - 8.1 g/dL 6.5  6.4  6.6   AST 10 - 35 U/L 18  24  43   ALT 9 - 46 U/L 18  21  50   Total Bilirubin 0.2 - 1.2 mg/dL 0.4  0.4  0.4    Radiology:    Cardiac Studies:   NA EKG:   EKG 01/07/2023: Normal sinus rhythm with rate of 83 bpm, left atrial enlargement, incomplete right bundle branch block.  Otherwise normal EKG.  Medications and allergies  No Known Allergies   Medication list   Current Outpatient Medications:    Multiple Vitamin (MULTIVITAMIN) tablet, Take 1 tablet by mouth daily., Disp: , Rfl:    mupirocin ointment (BACTROBAN) 2 %, , Disp: , Rfl:    sildenafil (VIAGRA) 100 MG tablet, Take by mouth as needed., Disp: , Rfl:    TALTZ 80 MG/ML SOAJ, INJECT 80MG  SUBCUTANEOUSLY EVERY 4 WEEKS, Disp: 1 mL, Rfl: 2  Assessment     ICD-10-CM   1. SOB (shortness of breath)  R06.02 Cardiopulmonary exercise test    PCV ECHOCARDIOGRAM COMPLETE    2. Dizziness  R42 EKG 12-Lead    3. Ankylosing spondylitis of cervical region [facility])  M45.2        Orders Placed This Encounter  Procedures   Cardiopulmonary exercise test    Standing Status:   Future    Standing Expiration Date:   01/07/2024   EKG 12-Lead   PCV ECHOCARDIOGRAM COMPLETE    Standing Status:   Future    Number of Occurrences:   1    Standing Expiration Date:   01/07/2024    No orders of the defined types were placed in this encounter.   Medications Discontinued During This Encounter  Medication Reason   azelastine (ASTELIN) 0.1 % nasal spray    Celecoxib (CELEBREX PO)    mometasone (NASONEX) 50 MCG/ACT nasal spray    sulfaSALAzine (AZULFIDINE) 500 MG tablet    traZODone (DESYREL) 50 MG tablet      Recommendations:   Jon Henderson is a 52 y.o.  Caucasian male patient with history of ankylosing spondylitis, brachial plexopathy but otherwise no cardiac history presents for evaluation of dizziness and  dyspnea, self-referred.  1. SOB (shortness of breath) Patient with shortness of breath, previously extremely healthy and fit, has noticed marked decrease in his overall muscle mass, has been having difficulty swallowing, dyspnea with routine activities, no associated chest pain.  Physical examination is completely normal with regard to cardiovascular system and his EKGs also is normal.  I suspect his dyspnea is related to musculoskeletal etiology and neuromuscular etiology, he has follow-up appointment with neurology soon.  From cardiac stress test standpoint, cardiopulmonary stress would be most appropriate to evaluate both his pulmonary status and cardiac status.  I will also obtain an echocardiogram to exclude any structural heart disease although his physical examination is normal.  I will personally perform the test and if I find abnormalities,  will perform further evaluation. Otherwise unless new on ongoing symptoms(patient advised to contact us), preventive  therapy is recommended. I will then see the patient on a PRN basis.  - Cardiopulmonary exercise test; Future - PCV ECHOCARDIOGRAM COMPLETE; Future  2. Dizziness He is minimally orthostatic, again this may suggest primary autonomic insufficiency versus a neurologic issue than cardiac issue, cardiopulmonary stress test and echocardiogram will certainly shed more light on to this. - EKG 12-Lead  3. Ankylosing spondylitis of cervical region [facility]) He is presently on immunosuppressive therapy, do not suspect his dyspnea is related to this.  He does not have any valvular disease on examination.  However echocardiogram has been ordered.   Yates Decamp, MD, [facility] 01/09/2023, 5:13 PM Office: [phone]

## 2023-01-09 ENCOUNTER — Ambulatory Visit: Payer: BC Managed Care – PPO

## 2023-01-09 DIAGNOSIS — R0602 Shortness of breath: Secondary | ICD-10-CM

## 2023-01-13 DIAGNOSIS — M0569 Rheumatoid arthritis of multiple sites with involvement of other organs and systems: Secondary | ICD-10-CM | POA: Diagnosis not present

## 2023-01-20 DIAGNOSIS — R0602 Shortness of breath: Secondary | ICD-10-CM | POA: Diagnosis not present

## 2023-01-20 DIAGNOSIS — M6281 Muscle weakness (generalized): Secondary | ICD-10-CM | POA: Diagnosis not present

## 2023-01-20 DIAGNOSIS — M79602 Pain in left arm: Secondary | ICD-10-CM | POA: Diagnosis not present

## 2023-01-20 DIAGNOSIS — Z87891 Personal history of nicotine dependence: Secondary | ICD-10-CM | POA: Diagnosis not present

## 2023-02-04 ENCOUNTER — Ambulatory Visit (HOSPITAL_COMMUNITY): Payer: Managed Care, Other (non HMO) | Attending: Cardiology

## 2023-02-04 DIAGNOSIS — M456 Ankylosing spondylitis lumbar region: Secondary | ICD-10-CM | POA: Diagnosis not present

## 2023-02-04 DIAGNOSIS — Z87891 Personal history of nicotine dependence: Secondary | ICD-10-CM | POA: Insufficient documentation

## 2023-02-04 DIAGNOSIS — R0602 Shortness of breath: Secondary | ICD-10-CM

## 2023-02-04 DIAGNOSIS — R0600 Dyspnea, unspecified: Secondary | ICD-10-CM

## 2023-02-04 NOTE — Progress Notes (Signed)
Cardiopulmonary stress test 02/04/2023: Exercise testing with gas exchange demonstrates normal functional capacity when compared to matched sedentary norms. There is no evidence of cardiopulmonary limitation, and in fact, excellent ventilatory threshold indicates effective exercise training.  Left message.

## 2023-02-10 ENCOUNTER — Telehealth: Payer: Self-pay | Admitting: Pharmacist

## 2023-02-10 NOTE — Telephone Encounter (Signed)
Patient has had change in insurance - he uploaded pic of new card via MyChart. Unable to complete TALTZ PA via CMM - form downloaded from online link: [email or web address]  BIN: [id number] PCN: [id number] Group: [id number] ID: [id number]  URGENT prior authorization for Taltz faxed to [employer] with clinicals: Fax: [phone]  Chesley Mires, PharmD, MPH, BCPS, CPP Clinical Pharmacist (Rheumatology and Pulmonology)

## 2023-02-19 NOTE — Telephone Encounter (Signed)
Received a fax regarding Prior Authorization from  [employer]  for TALTZ. Authorization has been DENIED because Altamease Oiler is a plan exclusion. Will need to contact help desk for plan exclusion request or otherwise will need to enroll patient into Taltz Together bridge program while we work through [employer]film[employer]video.  Chesley Mires, PharmD, MPH, BCPS, CPP Clinical Pharmacist (Rheumatology and Pulmonology)

## 2023-02-24 NOTE — Telephone Encounter (Signed)
Medication Samples have been provided to the patient.  Drug name: Altamease Oiler       Strength: 80 mg        Qty: 1  LOT: [id number]  Exp.Date: February 16, 2024  Dosing instructions: Inject 80 mg into skin every 4 weeks.

## 2023-02-24 NOTE — Telephone Encounter (Addendum)
Called insurance regarding Taltz denial. All reps occupied nad had to leave VM. Left VM requesting clarification on any exception form.  Bridge program enrollment processed initiated on portal. Patient portion emailed to pt via [employer]. Provider form printed for Dr. Fatima Sanger signature  Phone: [phone]  Patient may stop by clinic for Taltz sample.  Chesley Mires, PharmD, MPH, BCPS, CPP Clinical Pharmacist (Rheumatology and Pulmonology)

## 2023-02-25 NOTE — Telephone Encounter (Signed)
Received return call from [employer]. Per rep, Altamease Oiler is not on formulary and only Harriette Ohara is covered by his plan. There is no pathway for appealing or exception requests to be made. I requested she send a formalized letter stating this so we could submit to [employer] for bride program enrollment. She will fax  Chesley Mires, PharmD, MPH, BCPS, CPP Clinical Pharmacist (Rheumatology and Pulmonology)

## 2023-02-25 NOTE — Telephone Encounter (Signed)
Received signed provider and patient forms for Taltz bridge program. Faxed for bridge program enrollment with PA denial letter and med list.  Fax: [phone] Phone: [phone]  Chesley Mires, PharmD, MPH, BCPS, CPP Clinical Pharmacist (Rheumatology and Pulmonology)

## 2023-02-26 NOTE — Telephone Encounter (Signed)
Received fax from [employer] confirming receipt of forms.  Program ID [id number]  Chesley Mires, PharmD, MPH, BCPS, CPP Clinical Pharmacist (Rheumatology and Pulmonology)

## 2023-02-26 NOTE — Telephone Encounter (Signed)
Sarge from [employer] contacted the office and states they received the patient enrollment form but it is missing information. Sarge states a couple things it was missing was section 5 where the diagnoses goes and the patient and provider signature and sign date. The call back number is [phone] and there hours are 8am-10pm. Please advise.

## 2023-02-27 NOTE — Telephone Encounter (Signed)
Taltz Together form was faxed in its entirety. This has been refaxed today  Fax: [phone] Phone: [phone]  Chesley Mires, PharmD, MPH, BCPS, CPP Clinical Pharmacist (Rheumatology and Pulmonology)

## 2023-03-02 NOTE — Telephone Encounter (Signed)
Received fax from [employer] that patient's PA for Altamease Oiler has been denied but patient is eligible for [employer] program. A program rep will be reaching out to patient to relay the results of the PA.  Program ID [id number]  MyChart message sent to patient to advise.  Chesley Mires, PharmD, MPH, BCPS, CPP Clinical Pharmacist (Rheumatology and Pulmonology)

## 2023-03-10 NOTE — Telephone Encounter (Signed)
Contacted [employer]altz [employer] to receive update on patients enrollment in bridge program. Their application was approved at the $25 copay-level and patient was informed of the outcome on 03/03/2023. A prescription for Altamease Oiler was sent to [facility] specialty pharmacy. Patient is aware of this. [facility] will contact patient to coordinate prescription delivery.   Sofie Rower, PharmD, [employer] PGY1

## 2023-03-24 NOTE — Telephone Encounter (Signed)
Called patient to follow up on Taltz prescription. He received the prescription 4-5 days ago.  Sofie Rower, PharmD, [employer] PGY1

## 2023-04-02 ENCOUNTER — Other Ambulatory Visit: Payer: Self-pay | Admitting: Physician Assistant

## 2023-04-02 DIAGNOSIS — M458 Ankylosing spondylitis sacral and sacrococcygeal region: Secondary | ICD-10-CM

## 2023-04-03 NOTE — Telephone Encounter (Signed)
Last Fill: 10/21/2022  Labs: 12/09/2022  CBC and CMP WNL   TB Gold: 07/29/2022 negative    Next Visit: 05/11/2023  Last Visit: 12/09/2022  DX:Ankylosing spondylitis of multiple sites in spine   Current Dose per office note on 12/09/2022: Taltz 80 mg sq injections every 28 days.   Advised patient he is due to update labs. Patient states he will update on Monday, 04/06/2023.  Upon chart review, [facility] faxed rx with bridge program application on 02/27/2023 for a month supply with 2 refills. No print rx has been placed to reflect that.

## 2023-04-27 NOTE — Progress Notes (Signed)
Office Visit Note  Patient: Jon Henderson             Date of Birth: November 01, 1970           MRN: [id number]             PCP: Default, Provider, MD Referring: Elie Confer, NP Visit Date: 05/11/2023 Occupation: @GUAROCC @  Subjective:  Lower back pain   History of Present Illness: Jon Henderson is a 52 y.o. male with history of ankylosing spondylitis and DDD.  Patient remains on Taltz 80 mg sq injections every 28 days.  He is tolerating Taltz without any side effects or injection site reactions.  Patient denies any recent ankylosing spondylitis flares.  He denies any joint swelling at this time.  He states that he was recently reached down to pet his dog and has had an exacerbation of midline lower back pain.  Patient states that he has fractured 2 disks in his lower back in the past.  He has been taking prednisone 40 mg daily for the past 5 days.  He is not experiencing any symptoms of sciatica at this time.  He has been experiencing morning stiffness as well as muscle spasms with positional changes.  He denies any Achilles tendinitis or plantar fasciitis.  He denies any recent or recurrent infections.  He denies any new medical conditions.  Activities of Daily Living:  Patient reports morning stiffness for several hours.   Patient Reports nocturnal pain.  Difficulty dressing/grooming: Reports Difficulty climbing stairs: Reports Difficulty getting out of chair: Reports Difficulty using hands for taps, buttons, cutlery, and/or writing: Denies  Review of Systems  Constitutional:  Positive for fatigue.  HENT:  Negative for mouth sores and mouth dryness.   Eyes:  Negative for dryness.  Respiratory:  Negative for shortness of breath.   Cardiovascular:  Negative for chest pain and palpitations.  Gastrointestinal:  Negative for blood in stool, constipation and diarrhea.  Endocrine: Negative for increased urination.  Genitourinary:  Negative for involuntary urination.   Musculoskeletal:  Positive for joint pain, joint pain, joint swelling, muscle weakness and morning stiffness. Negative for myalgias, muscle tenderness and myalgias.  Skin:  Negative for color change, rash and sensitivity to sunlight.  Allergic/Immunologic: Negative for susceptible to infections.  Neurological:  Positive for headaches. Negative for dizziness.  Hematological:  Negative for swollen glands.  Psychiatric/Behavioral:  Positive for sleep disturbance. Negative for depressed mood. The patient is not nervous/anxious.     PMFS History:  Patient Active Problem List   Diagnosis Date Noted   Ankylosing spondylitis (HCC) 06/12/2016   Sacroiliitis (HCC) 06/12/2016   High risk medication use 06/12/2016   Cardiac dysrhythmia 04/20/2008   DDD (degenerative disc disease), lumbar 04/20/2008   COUGH 04/20/2008    Past Medical History:  Diagnosis Date   Ankylosing spondylitis (HCC)    Disc disorder of lumbar region     Family History  Problem Relation Age of Onset   Ankylosing spondylitis Mother    COPD Father    Drug abuse Sister    Alcohol abuse Sister    Stroke Maternal Grandfather    Healthy Daughter    ADD / ADHD Daughter    Healthy Daughter    ADD / ADHD Daughter    Healthy Daughter    Past Surgical History:  Procedure Laterality Date   LUMBAR DISC SURGERY     Social History   Social History Narrative   Not on file   Immunization History  Administered Date(s) Administered   Influenza-Unspecified 03/23/2018   PFIZER(Purple Top)SARS-COV-2 Vaccination 08/29/2019, 09/19/2019     Objective: Vital Signs: BP 126/85 (BP Location: Left Arm, Patient Position: Sitting, Cuff Size: Normal)   Pulse 81   Resp 17   Ht 6' (1.829 m)   Wt 194 lb 6.4 oz (88.2 kg)   BMI 26.37 kg/m    Physical Exam Vitals and nursing note reviewed.  Constitutional:      Appearance: He is well-developed.  HENT:     Head: Normocephalic and atraumatic.  Eyes:     Conjunctiva/sclera:  Conjunctivae normal.     Pupils: Pupils are equal, round, and reactive to light.  Cardiovascular:     Rate and Rhythm: Normal rate and regular rhythm.     Heart sounds: Normal heart sounds.  Pulmonary:     Effort: Pulmonary effort is normal.     Breath sounds: Normal breath sounds.  Abdominal:     General: Bowel sounds are normal.     Palpations: Abdomen is soft.  Musculoskeletal:     Cervical back: Normal range of motion and neck supple.  Skin:    General: Skin is warm and dry.     Capillary Refill: Capillary refill takes less than 2 seconds.  Neurological:     Mental Status: He is alert and oriented to person, place, and time.  Psychiatric:        Behavior: Behavior normal.      Musculoskeletal Exam: C-spine is limited range of motion with lateral rotation.  Midline spinal tenderness in the lumbar region.  No SI joint tenderness upon palpation today.  Shoulder joints, elbow joints, wrist joints, MCPs, PIPs, DIPs have good range of motion with no synovitis.  Complete fist formation bilaterally.  Hip joints have good range of motion with no groin pain.  Knee joints have good range of motion with no warmth or effusion.  Ankle joints have good range of motion with no tenderness or joint swelling.  No evidence of Achilles tendinitis.  CDAI Exam: CDAI Score: -- Patient Global: --; Provider Global: -- Swollen: --; Tender: -- Joint Exam 05/11/2023   No joint exam has been documented for this visit   There is currently no information documented on the homunculus. Go to the Rheumatology activity and complete the homunculus joint exam.  Investigation: No additional findings.  Imaging: No results found.  Recent Labs: Lab Results  Component Value Date   WBC 6.5 04/29/2023   HGB 15.7 04/29/2023   PLT 280 04/29/2023   NA 139 04/29/2023   K 4.1 04/29/2023   CL 104 04/29/2023   CO2 24 04/29/2023   GLUCOSE 110 (H) 04/29/2023   BUN 14 04/29/2023   CREATININE 0.91 04/29/2023    BILITOT 0.4 04/29/2023   ALKPHOS 64 07/16/2018   AST 20 04/29/2023   ALT 21 04/29/2023   PROT 6.8 04/29/2023   ALBUMIN 4.3 07/16/2018   CALCIUM 9.3 04/29/2023   GFRAA 94 11/19/2020   QFTBGOLDPLUS NEGATIVE 07/29/2022    Speciality Comments: No specialty comments available.  Procedures:  No procedures performed Allergies: Patient has no known allergies.    Assessment / Plan:     Visit Diagnoses: Ankylosing spondylitis of multiple sites in spine [facility]): He has not had any signs or symptoms of a flare.  No synovitis or dactylitis noted on examination today.  No evidence of Achilles tendinitis or plantar fasciitis.  No signs or symptoms of uveitis.  He has been experiencing increased discomfort in his  lower back for the past 5 days after reaching down to pet his dog.  He has been on prednisone 40 mg daily for the last 5 days.  Patient previously ruptured 2 discs in his lower back-requiring surgical intervention in the past.  He is not currently experiencing any symptoms of sciatica.  He has some midline spinal tenderness in the lumbar region.  He has been experiencing muscle spasms with positional changes.  He declined a muscle relaxer at this time.  He has not yet followed back up with his neurosurgeon.  If his symptoms do not improve he plans on following up with his surgeon.  Offered a referral to physical therapy--he will notify us when and if he would like a referral placed. Overall his symptoms have been well-controlled on Taltz 80 mg subcutaneous injections every 28 days.  He continues to tolerate Taltz without any side effects or injection site reactions.  He has not had any interruptions in therapy.  No medication changes will be made at this time.  He is vies notify us if he develop signs or symptoms of a flare.  High risk medication use - Taltz 80 mg sq injections every 28 days. CBC and CMP updated on 04/29/23. His next lab work will be due in January and every 3 months.  TB gold negative  on 07/29/22.  No recent or recurrent infections.  Discussed the importance of holding taltz if he develops signs or symptoms of an infection and to resume once the infection has completely cleared.   Sacroiliitis [facility]): No SI joint tenderness upon palpation today.   DDD (degenerative disc disease), cervical: Limited ROM with lateral rotation.   DDD (degenerative disc disease), thoracic: No midline spinal tenderness in the thoracic region.  Chronic right SI joint pain: No SI joint tenderness upon palpation today.   DDD (degenerative disc disease), lumbar: Patient presents today with an acute exacerbation of lower back pain.  He was reaching down to pet his dog and has had discomfort since then.  He is not currently experiencing any symptoms of sciatica but has been experiencing muscle spasms.  He experiences stiffness with positional changes and has had difficulty with forward flexion of the spine.  He has been taking prednisone 40 mg daily for the last 5 days.  He declined a prescription for muscle relaxer at this time.  Offered a referral to physical therapy--he will notify us if his symptoms persist at which time we can place a referral to physical therapy.  He will also be following up with his spine specialist if his symptoms do not improve.  Chronic right shoulder pain: Good ROM with no discomfort currently.   Other medical conditions are listed as follows:   History of gastroesophageal reflux (GERD)  Other fatigue  Neuropathy  Orders: No orders of the defined types were placed in this encounter.  No orders of the defined types were placed in this encounter.    Follow-Up Instructions: Return in about 5 months (around 10/09/2023) for Ankylosing Spondylitis.   Gearldine Bienenstock, PA-C  Note - This record has been created using Dragon software.  Chart creation errors have been sought, but may not always  have been located. Such creation errors do not reflect on  the standard of  medical care.

## 2023-04-29 ENCOUNTER — Other Ambulatory Visit: Payer: Self-pay | Admitting: *Deleted

## 2023-04-29 DIAGNOSIS — Z79899 Other long term (current) drug therapy: Secondary | ICD-10-CM

## 2023-04-30 ENCOUNTER — Other Ambulatory Visit: Payer: Self-pay | Admitting: *Deleted

## 2023-04-30 DIAGNOSIS — M458 Ankylosing spondylitis sacral and sacrococcygeal region: Secondary | ICD-10-CM

## 2023-04-30 LAB — COMPLETE METABOLIC PANEL WITH GFR
AG Ratio: 1.7 (calc) (ref 1.0–2.5)
ALT: 21 U/L (ref 9–46)
AST: 20 U/L (ref 10–35)
Albumin: 4.3 g/dL (ref 3.6–5.1)
Alkaline phosphatase (APISO): 60 U/L (ref 35–144)
BUN: 14 mg/dL (ref 7–25)
CO2: 24 mmol/L (ref 20–32)
Calcium: 9.3 mg/dL (ref 8.6–10.3)
Chloride: 104 mmol/L (ref 98–110)
Creat: 0.91 mg/dL (ref 0.70–1.30)
Globulin: 2.5 g/dL (ref 1.9–3.7)
Glucose, Bld: 110 mg/dL — ABNORMAL HIGH (ref 65–99)
Potassium: 4.1 mmol/L (ref 3.5–5.3)
Sodium: 139 mmol/L (ref 135–146)
Total Bilirubin: 0.4 mg/dL (ref 0.2–1.2)
Total Protein: 6.8 g/dL (ref 6.1–8.1)
eGFR: 101 mL/min/{1.73_m2} (ref >=60)

## 2023-04-30 LAB — CBC WITH DIFFERENTIAL/PLATELET
Absolute Monocytes: 735 {cells}/uL (ref 200–950)
Basophils Absolute: 52 {cells}/uL (ref 0–200)
Basophils Relative: 0.8 %
Eosinophils Absolute: 33 {cells}/uL (ref 15–500)
Eosinophils Relative: 0.5 %
HCT: 47.2 % (ref 38.5–50.0)
Hemoglobin: 15.7 g/dL (ref 13.2–17.1)
Lymphs Abs: 1274 {cells}/uL (ref 850–3900)
MCH: 32.6 pg (ref 27.0–33.0)
MCHC: 33.3 g/dL (ref 32.0–36.0)
MCV: 98.1 fL (ref 80.0–100.0)
MPV: 9.4 fL (ref 7.5–12.5)
Monocytes Relative: 11.3 %
Neutro Abs: 4407 {cells}/uL (ref 1500–7800)
Neutrophils Relative %: 67.8 %
Platelets: 280 10*3/uL (ref 140–400)
RBC: 4.81 10*6/uL (ref 4.20–5.80)
RDW: 12.2 % (ref 11.0–15.0)
Total Lymphocyte: 19.6 %
WBC: 6.5 10*3/uL (ref 3.8–10.8)

## 2023-04-30 MED ORDER — TALTZ 80 MG/ML ~~LOC~~ SOAJ: SUBCUTANEOUS | 0 refills | Status: DC

## 2023-04-30 NOTE — Progress Notes (Signed)
Glucose is 110.  Rest of CMP WNL.  CBC WNL.

## 2023-04-30 NOTE — Telephone Encounter (Signed)
Last Fill: 02/27/2023  Labs: 04/29/2023 Glucose is 110. Rest of CMP WNL. CBC WNL.  TB Gold: 07/29/2022   TB gold negative   Next Visit: 05/11/2023  Last Visit: 12/09/2022  DX: Ankylosing spondylitis of multiple sites in spine   Current Dose per office note 12/09/2022: Taltz 80 mg sq injections every 28 days.   Okay to refill Taltz?

## 2023-05-11 ENCOUNTER — Encounter: Payer: Self-pay | Admitting: Physician Assistant

## 2023-05-11 ENCOUNTER — Ambulatory Visit: Payer: 59 | Attending: Physician Assistant | Admitting: Physician Assistant

## 2023-05-11 VITALS — BP 126/85 | HR 81 | Resp 17 | Ht 72.0 in | Wt 194.4 lb

## 2023-05-11 DIAGNOSIS — R5383 Other fatigue: Secondary | ICD-10-CM

## 2023-05-11 DIAGNOSIS — G629 Polyneuropathy, unspecified: Secondary | ICD-10-CM

## 2023-05-11 DIAGNOSIS — M450 Ankylosing spondylitis of multiple sites in spine: Secondary | ICD-10-CM | POA: Diagnosis not present

## 2023-05-11 DIAGNOSIS — M25511 Pain in right shoulder: Secondary | ICD-10-CM

## 2023-05-11 DIAGNOSIS — G8929 Other chronic pain: Secondary | ICD-10-CM

## 2023-05-11 DIAGNOSIS — M5030 Other cervical disc degeneration, unspecified cervical region: Secondary | ICD-10-CM | POA: Diagnosis not present

## 2023-05-11 DIAGNOSIS — M461 Sacroiliitis, not elsewhere classified: Secondary | ICD-10-CM | POA: Diagnosis not present

## 2023-05-11 DIAGNOSIS — Z79899 Other long term (current) drug therapy: Secondary | ICD-10-CM

## 2023-05-11 DIAGNOSIS — M533 Sacrococcygeal disorders, not elsewhere classified: Secondary | ICD-10-CM

## 2023-05-11 DIAGNOSIS — M51369 Other intervertebral disc degeneration, lumbar region without mention of lumbar back pain or lower extremity pain: Secondary | ICD-10-CM

## 2023-05-11 DIAGNOSIS — M5134 Other intervertebral disc degeneration, thoracic region: Secondary | ICD-10-CM

## 2023-05-11 DIAGNOSIS — Z8719 Personal history of other diseases of the digestive system: Secondary | ICD-10-CM

## 2023-05-11 DIAGNOSIS — M5136 Other intervertebral disc degeneration, lumbar region with discogenic back pain only: Secondary | ICD-10-CM

## 2023-05-11 NOTE — Patient Instructions (Signed)

## 2023-08-26 ENCOUNTER — Other Ambulatory Visit: Payer: Self-pay | Admitting: *Deleted

## 2023-08-26 DIAGNOSIS — Z111 Encounter for screening for respiratory tuberculosis: Secondary | ICD-10-CM

## 2023-08-26 DIAGNOSIS — Z79899 Other long term (current) drug therapy: Secondary | ICD-10-CM

## 2023-08-26 DIAGNOSIS — Z9225 Personal history of immunosupression therapy: Secondary | ICD-10-CM

## 2023-08-27 NOTE — Progress Notes (Signed)
Glucose is 113. Rest of CMP WNL. CBC stable. No medication changes recommended at this time

## 2023-08-28 LAB — COMPLETE METABOLIC PANEL WITH GFR
AG Ratio: 1.8 (calc) (ref 1.0–2.5)
ALT: 18 U/L (ref 9–46)
AST: 23 U/L (ref 10–35)
Albumin: 4.4 g/dL (ref 3.6–5.1)
Alkaline phosphatase (APISO): 64 U/L (ref 35–144)
BUN: 15 mg/dL (ref 7–25)
CO2: 25 mmol/L (ref 20–32)
Calcium: 9.1 mg/dL (ref 8.6–10.3)
Chloride: 100 mmol/L (ref 98–110)
Creat: 0.92 mg/dL (ref 0.70–1.30)
Globulin: 2.4 g/dL (ref 1.9–3.7)
Glucose, Bld: 113 mg/dL — ABNORMAL HIGH (ref 65–99)
Potassium: 4 mmol/L (ref 3.5–5.3)
Sodium: 138 mmol/L (ref 135–146)
Total Bilirubin: 0.4 mg/dL (ref 0.2–1.2)
Total Protein: 6.8 g/dL (ref 6.1–8.1)
eGFR: 100 mL/min/{1.73_m2} (ref >=60)

## 2023-08-28 LAB — QUANTIFERON-TB GOLD PLUS
Mitogen-NIL: 7.64 [IU]/mL
NIL: 0.02 [IU]/mL
QuantiFERON-TB Gold Plus: NEGATIVE
TB1-NIL: 0 [IU]/mL
TB2-NIL: 0 [IU]/mL

## 2023-08-28 LAB — CBC WITH DIFFERENTIAL/PLATELET
Absolute Lymphocytes: 1292 {cells}/uL (ref 850–3900)
Absolute Monocytes: 774 {cells}/uL (ref 200–950)
Basophils Absolute: 43 {cells}/uL (ref 0–200)
Basophils Relative: 0.5 %
Eosinophils Absolute: 9 {cells}/uL — ABNORMAL LOW (ref 15–500)
Eosinophils Relative: 0.1 %
HCT: 45.3 % (ref 38.5–50.0)
Hemoglobin: 15.4 g/dL (ref 13.2–17.1)
MCH: 33.2 pg — ABNORMAL HIGH (ref 27.0–33.0)
MCHC: 34 g/dL (ref 32.0–36.0)
MCV: 97.6 fL (ref 80.0–100.0)
MPV: 9.4 fL (ref 7.5–12.5)
Monocytes Relative: 9.1 %
Neutro Abs: 6384 {cells}/uL (ref 1500–7800)
Neutrophils Relative %: 75.1 %
Platelets: 256 10*3/uL (ref 140–400)
RBC: 4.64 10*6/uL (ref 4.20–5.80)
RDW: 12.6 % (ref 11.0–15.0)
Total Lymphocyte: 15.2 %
WBC: 8.5 10*3/uL (ref 3.8–10.8)

## 2023-08-30 NOTE — Progress Notes (Signed)
TB Gold negative

## 2023-08-31 NOTE — Telephone Encounter (Signed)
Discussed with Dr. Corliss Skains.   She is ok with sending in prednisone 5 mg tablets-usual taper is to take prednisone 20 mg tapering by 5 mg every 2 days.

## 2023-09-01 MED ORDER — PREDNISONE 5 MG PO TABS: ORAL_TABLET | ORAL | 0 refills | Status: DC

## 2023-09-22 NOTE — Progress Notes (Signed)
 Office Visit Note  Patient: Jon Henderson             Date of Birth: [birth year]           MRN: [id number]             PCP: Default, Provider, MD Referring: No ref. provider found Visit Date: 10/06/2023 Occupation: @GUAROCC @  Subjective:  Right SI joint pain   History of Present Illness: Jon Henderson is a 53 y.o. male with history of ankylosing spondylitis and DDD.  Patient remains on  Taltz 80 mg sq injections every 28 days.  He continues to tolerate Taltz without any side effects or injection site reactions.  He has not had any recent gaps in therapy.  Patient's been experiencing recurrent flares involving his right SI joint.  Patient reports that 99% of his daily pain is stemming from his SI joint but at times he does have some increased discomfort in his right hip which he attributes to gait changes.  This discomfort has been significantly interfering with his quality of life.  He has been able to use a stationary bike due to an exacerbation of discomfort.  Patient states that he tried taking Celebrex for 2 weeks which righted temporary relief.  Patient reports that his symptoms are also responsive to prednisone tapers but only temporarily.  Patient would like to consider referral to physical therapy to help alleviate his symptoms in order for him to increase his activity level.   Activities of Daily Living:  Patient reports morning stiffness for 1-2 hours.   Patient Denies nocturnal pain.  Difficulty dressing/grooming: Reports Difficulty climbing stairs: Reports Difficulty getting out of chair: Reports Difficulty using hands for taps, buttons, cutlery, and/or writing: Reports  Review of Systems  Constitutional:  Positive for fatigue.  HENT:  Negative for mouth sores and mouth dryness.   Eyes:  Negative for dryness.  Respiratory:  Negative for shortness of breath.   Cardiovascular:  Negative for chest pain and palpitations.  Gastrointestinal:  Positive for diarrhea. Negative  for blood in stool and constipation.  Endocrine: Negative for increased urination.  Genitourinary:  Negative for involuntary urination.  Musculoskeletal:  Positive for joint pain, joint pain, joint swelling, myalgias, muscle weakness, morning stiffness, muscle tenderness and myalgias. Negative for gait problem.  Skin:  Negative for color change, rash, hair loss and sensitivity to sunlight.  Allergic/Immunologic: Negative for susceptible to infections.  Neurological:  Positive for dizziness and headaches.  Hematological:  Negative for swollen glands.  Psychiatric/Behavioral:  Positive for depressed mood and sleep disturbance. The patient is nervous/anxious.     PMFS History:  Patient Active Problem List   Diagnosis Date Noted   Ankylosing spondylitis (HCC) 06/12/2016   Sacroiliitis (HCC) 06/12/2016   High risk medication use 06/12/2016   Cardiac dysrhythmia 04/20/2008   DDD (degenerative disc disease), lumbar 04/20/2008   COUGH 04/20/2008    Past Medical History:  Diagnosis Date   Ankylosing spondylitis (HCC)    Disc disorder of lumbar region     Family History  Problem Relation Age of Onset   Ankylosing spondylitis Mother    COPD Father    Drug abuse Sister    Alcohol abuse Sister    Stroke Maternal Grandfather    Healthy Daughter    ADD / ADHD Daughter    Healthy Daughter    ADD / ADHD Daughter    Healthy Daughter    Past Surgical History:  Procedure Laterality Date   LUMBAR  DISC SURGERY     Social History   Social History Narrative   Not on file   Immunization History  Administered Date(s) Administered   Influenza-Unspecified 03/23/2018   PFIZER(Purple Top)SARS-COV-2 Vaccination 08/29/2019, 09/19/2019     Objective: Vital Signs: BP 124/75 (BP Location: Left Arm, Patient Position: Sitting, Cuff Size: Large)   Pulse 79   Resp 14   Ht 6' (1.829 m)   Wt 195 lb (88.5 kg)   BMI 26.45 kg/m    Physical Exam Vitals and nursing note reviewed.  Constitutional:       Appearance: He is well-developed.  HENT:     Head: Normocephalic and atraumatic.  Eyes:     Conjunctiva/sclera: Conjunctivae normal.     Pupils: Pupils are equal, round, and reactive to light.  Cardiovascular:     Rate and Rhythm: Normal rate and regular rhythm.     Heart sounds: Normal heart sounds.  Pulmonary:     Effort: Pulmonary effort is normal.     Breath sounds: Normal breath sounds.  Abdominal:     General: Bowel sounds are normal.     Palpations: Abdomen is soft.  Musculoskeletal:     Cervical back: Normal range of motion and neck supple.  Skin:    General: Skin is warm and dry.     Capillary Refill: Capillary refill takes less than 2 seconds.  Neurological:     Mental Status: He is alert and oriented to person, place, and time.  Psychiatric:        Behavior: Behavior normal.      Musculoskeletal Exam: C-spine has limited ROM with lateral rotation.  No midline spinal tenderness currently. Tenderness of the right SI joint.  Shoulder joints, elbow joints, wrist joints, MCPs, PIPs, and DIPs good ROM with no synovitis.  Complete fist formation bilaterally.  Hip joints have good ROM with no groin pain currently. Discomfort with ROM of the right knee with tenderness along the hamstrings on the posterior/medial aspect of the right knee.  Ankle joints have good ROM with no tenderness or joint swelling.   CDAI Exam: CDAI Score: -- Patient Global: --; Provider Global: -- Swollen: --; Tender: -- Joint Exam 10/06/2023   No joint exam has been documented for this visit   There is currently no information documented on the homunculus. Go to the Rheumatology activity and complete the homunculus joint exam.  Investigation: No additional findings.  Imaging: No results found.  Recent Labs: Lab Results  Component Value Date   WBC 8.5 08/26/2023   HGB 15.4 08/26/2023   PLT 256 08/26/2023   NA 138 08/26/2023   K 4.0 08/26/2023   CL 100 08/26/2023   CO2 25 08/26/2023    GLUCOSE 113 (H) 08/26/2023   BUN 15 08/26/2023   CREATININE 0.92 08/26/2023   BILITOT 0.4 08/26/2023   ALKPHOS 64 07/16/2018   AST 23 08/26/2023   ALT 18 08/26/2023   PROT 6.8 08/26/2023   ALBUMIN 4.3 07/16/2018   CALCIUM 9.1 08/26/2023   GFRAA 94 11/19/2020   QFTBGOLDPLUS NEGATIVE 08/26/2023    Speciality Comments: No specialty comments available.  Procedures:  Sacroiliac Joint Inj on 10/06/2023 11:03 AM Indications: pain Details: 27 G 1.5 in needle, posterior approach Medications: 1 mL lidocaine 1 %; 40 mg triamcinolone acetonide 40 MG/ML Aspirate: 0 mL Outcome: tolerated well, no immediate complications Procedure, treatment alternatives, risks and benefits explained, specific risks discussed. Consent was given by the patient. Immediately prior to procedure a time out was  called to verify the correct patient, procedure, equipment, support staff and site/side marked as required. Patient was prepped and draped in the usual sterile fashion.     Allergies: Patient has no known allergies.   Assessment / Plan:     Visit Diagnoses: Ankylosing spondylitis of multiple sites in spine [facility]): Patient presents today with ongoing pain involving the right SI joint.  He has been experiencing recurrent flares involving his SI joints and has tried taking prednisone and Celebrex for symptomatic relief.  His discomfort has been interfering with his quality of life.  He has been unable to ride a stationary bike in the mornings due to the discomfort.  He has also noticed a change in his gait which often times causes an exacerbation of pain in his right hip.  Patient remains on Taltz 80 mg sq injections every 28 days.  He is tolerating Taltz without any side effects or injection site reactions.  Has not had any recent gaps in therapy.  He will remain on Taltz as prescribed.  Different treatment options were discussed.  He requested a right SI joint cortisone injection today.  He tolerated the procedure well.   Procedure note was completed above.  Aftercare was discussed.  A referral to physical therapy will be placed.  He was advised to notify us if his symptoms persist or worsen.  High risk medication use - Taltz 80 mg sq injections every 28 days. CBC and CMP updated on 08/26/23.  His next lab work will be due at the end of April and every 3 months to monitor for drug toxicity. TB gold negative on 08/26/23.  No recent or recurrent infections. Discussed the importance of holding taltz if he develops signs or symptoms of an infection and to resume once the infection has completely cleared.   Sacroiliitis (HCC) -Patient presents today with chronic pain in his right SI joint.  He has been experiencing recurrent flares.  He has tried taking a 2-week course of Celebrex as well as a prednisone taper which provides mild temporary relief but his symptoms have recurred.  A referral to physical therapy was placed today.   A right SI joint cortisone injection will be performed today.  He tolerated procedure well.  Procedure notes completed above.  Aftercare was discussed.  Plan: Sacroiliac Joint Inj  DDD (degenerative disc disease), cervical: C-spine has limited ROM with lateral rotation.  No symptoms of radiculopathy at this time.   DDD (degenerative disc disease), thoracic: No midline spinal tenderness in the thoracic region noted.   Chronic right SI joint pain -Chronic pain.  Interfering with quality of life.  Unable to perform his normal exercise regimen including using a stationary bike due to the discomfort.  Tenderness of the right SI joint noted today.  A right SI joint cortisone injection was performed today.  He tolerated the procedure well.  Procedure note was completed above.  Aftercare was discussed.  A referral to PT will be placed.   Plan: Sacroiliac Joint Inj  Degeneration of intervertebral disc of lumbar region with discogenic back pain: No midline spinal tenderness currently.  A referral to physical  therapy placed today.   Chronic right shoulder pain: Not currently symptomatic.   Other medical conditions are listed as follows:   History of gastroesophageal reflux (GERD)  Other fatigue  Neuropathy  Orders: Orders Placed This Encounter  Procedures   Sacroiliac Joint Inj   No orders of the defined types were placed in this encounter.  Follow-Up Instructions: Return in about 5 months (around 03/07/2024) for Ankylosing Spondylitis.   Gearldine Bienenstock, PA-C  Note - This record has been created using Dragon software.  Chart creation errors have been sought, but may not always  have been located. Such creation errors do not reflect on  the standard of medical care.

## 2023-10-06 ENCOUNTER — Encounter: Payer: Self-pay | Admitting: Physician Assistant

## 2023-10-06 ENCOUNTER — Ambulatory Visit: Payer: 59 | Attending: Physician Assistant | Admitting: Physician Assistant

## 2023-10-06 VITALS — BP 124/75 | HR 79 | Resp 14 | Ht 72.0 in | Wt 195.0 lb

## 2023-10-06 DIAGNOSIS — M461 Sacroiliitis, not elsewhere classified: Secondary | ICD-10-CM | POA: Diagnosis not present

## 2023-10-06 DIAGNOSIS — Z8719 Personal history of other diseases of the digestive system: Secondary | ICD-10-CM

## 2023-10-06 DIAGNOSIS — Z79899 Other long term (current) drug therapy: Secondary | ICD-10-CM

## 2023-10-06 DIAGNOSIS — M533 Sacrococcygeal disorders, not elsewhere classified: Secondary | ICD-10-CM

## 2023-10-06 DIAGNOSIS — G8929 Other chronic pain: Secondary | ICD-10-CM

## 2023-10-06 DIAGNOSIS — M5030 Other cervical disc degeneration, unspecified cervical region: Secondary | ICD-10-CM | POA: Diagnosis not present

## 2023-10-06 DIAGNOSIS — M450 Ankylosing spondylitis of multiple sites in spine: Secondary | ICD-10-CM

## 2023-10-06 DIAGNOSIS — M5134 Other intervertebral disc degeneration, thoracic region: Secondary | ICD-10-CM

## 2023-10-06 DIAGNOSIS — R5383 Other fatigue: Secondary | ICD-10-CM

## 2023-10-06 DIAGNOSIS — M5136 Other intervertebral disc degeneration, lumbar region with discogenic back pain only: Secondary | ICD-10-CM

## 2023-10-06 DIAGNOSIS — G629 Polyneuropathy, unspecified: Secondary | ICD-10-CM

## 2023-10-06 DIAGNOSIS — M25511 Pain in right shoulder: Secondary | ICD-10-CM

## 2023-10-06 MED ORDER — LIDOCAINE HCL 1 % IJ SOLN
1.0000 mL | INTRAMUSCULAR | Status: AC | PRN
  Administered 2023-10-06: 1 mL

## 2023-10-06 MED ORDER — TRIAMCINOLONE ACETONIDE 40 MG/ML IJ SUSP
40.0000 mg | INTRAMUSCULAR | Status: AC | PRN
  Administered 2023-10-06: 40 mg via INTRA_ARTICULAR

## 2023-10-06 NOTE — Addendum Note (Signed)
 Addended by: Ellen Henri on: 10/06/2023 12:04 PM   Modules accepted: Orders

## 2023-10-06 NOTE — Patient Instructions (Signed)
 Standing Labs We placed an order today for your standing lab work.   Please have your standing labs drawn at the end of April and every 3 months   Please have your labs drawn 2 weeks prior to your appointment so that the provider can discuss your lab results at your appointment, if possible.  Please note that you may see your imaging and lab results in MyChart before we have reviewed them. We will contact you once all results are reviewed. Please allow our office up to 72 hours to thoroughly review all of the results before contacting the office for clarification of your results.  WALK-IN LAB HOURS  Monday through Thursday from 8:00 am -12:30 pm and 1:00 pm-5:00 pm and Friday from 8:00 am-12:00 pm.  Patients with office visits requiring labs will be seen before walk-in labs.  You may encounter longer than normal wait times. Please allow additional time. Wait times may be shorter on  Monday and Thursday afternoons.  We do not book appointments for walk-in labs. We appreciate your patience and understanding with our staff.   Labs are drawn by [facility]. Please bring your co-pay at the time of your lab draw.  You may receive a bill from [facility] for your lab work.  Please note if you are on Hydroxychloroquine and and an order has been placed for a Hydroxychloroquine level,  you will need to have it drawn 4 hours or more after your last dose.  If you wish to have your labs drawn at another location, please call the office 24 hours in advance so we can fax the orders.  The office is located at [address], [address], [place], [place] [address]   If you have any questions regarding directions or hours of operation,  please call [phone].   As a reminder, please drink plenty of water prior to coming for your lab work. Thanks!

## 2023-12-22 MED ORDER — PREDNISONE 5 MG PO TABS: ORAL_TABLET | ORAL | 0 refills | Status: DC

## 2023-12-22 NOTE — Telephone Encounter (Signed)
Ok to send in prednisone 20 mg tapering by 5 mg every 2 days.   Take prednisone in the morning with food and avoid the use of NSAIDs

## 2023-12-23 ENCOUNTER — Other Ambulatory Visit: Payer: Self-pay | Admitting: Physician Assistant

## 2023-12-23 DIAGNOSIS — M458 Ankylosing spondylitis sacral and sacrococcygeal region: Secondary | ICD-10-CM

## 2023-12-23 NOTE — Telephone Encounter (Signed)
 Last Fill: 04/30/2023  Labs: 08/26/2023 Glucose is 113. Rest of CMP WNL. CBC stable. No medication changes recommended at this time  [facility] to update labs.  TB Gold: 08/26/2023 TB Gold Negative   Next Visit: 03/03/2024  Last Visit: 10/06/2023  DX:Ankylosing spondylitis of multiple sites in spine   Current Dose per office note 10/06/2023: Taltz  80 mg sq injections every 28 days.   Okay to refill Taltz ?

## 2023-12-30 LAB — LAB REPORT - SCANNED
EGFR: 108
Free T4: 1.7 ng/dL
TSH: 0.58 (ref 0.41–5.90)

## 2023-12-31 ENCOUNTER — Other Ambulatory Visit: Payer: Self-pay | Admitting: *Deleted

## 2023-12-31 DIAGNOSIS — Z79899 Other long term (current) drug therapy: Secondary | ICD-10-CM

## 2024-01-02 ENCOUNTER — Ambulatory Visit: Payer: Self-pay | Admitting: Rheumatology

## 2024-01-02 LAB — CBC WITH DIFFERENTIAL/PLATELET
Absolute Lymphocytes: 1711 {cells}/uL (ref 850–3900)
Absolute Monocytes: 825 {cells}/uL (ref 200–950)
Basophils Absolute: 37 {cells}/uL (ref 0–200)
Basophils Relative: 0.6 %
Eosinophils Absolute: 68 {cells}/uL (ref 15–500)
Eosinophils Relative: 1.1 %
HCT: 43.7 % (ref 38.5–50.0)
Hemoglobin: 14.2 g/dL (ref 13.2–17.1)
MCH: 32.5 pg (ref 27.0–33.0)
MCHC: 32.5 g/dL (ref 32.0–36.0)
MCV: 100 fL (ref 80.0–100.0)
MPV: 9.2 fL (ref 7.5–12.5)
Monocytes Relative: 13.3 %
Neutro Abs: 3559 {cells}/uL (ref 1500–7800)
Neutrophils Relative %: 57.4 %
Platelets: 228 10*3/uL (ref 140–400)
RBC: 4.37 10*6/uL (ref 4.20–5.80)
RDW: 13.7 % (ref 11.0–15.0)
Total Lymphocyte: 27.6 %
WBC: 6.2 10*3/uL (ref 3.8–10.8)

## 2024-01-02 LAB — COMPREHENSIVE METABOLIC PANEL WITH GFR
AG Ratio: 1.7 (calc) (ref 1.0–2.5)
ALT: 21 U/L (ref 9–46)
AST: 28 U/L (ref 10–35)
Albumin: 4.3 g/dL (ref 3.6–5.1)
Alkaline phosphatase (APISO): 57 U/L (ref 35–144)
BUN: 21 mg/dL (ref 7–25)
CO2: 28 mmol/L (ref 20–32)
Calcium: 9.1 mg/dL (ref 8.6–10.3)
Chloride: 102 mmol/L (ref 98–110)
Creat: 1.04 mg/dL (ref 0.70–1.30)
Globulin: 2.5 g/dL (ref 1.9–3.7)
Glucose, Bld: 80 mg/dL (ref 65–99)
Potassium: 3.9 mmol/L (ref 3.5–5.3)
Sodium: 140 mmol/L (ref 135–146)
Total Bilirubin: 0.7 mg/dL (ref 0.2–1.2)
Total Protein: 6.8 g/dL (ref 6.1–8.1)
eGFR: 86 mL/min/{1.73_m2} (ref >=60)

## 2024-01-02 NOTE — Progress Notes (Signed)
 CBC and CMP are normal.

## 2024-01-26 ENCOUNTER — Telehealth: Payer: Self-pay

## 2024-01-26 NOTE — Telephone Encounter (Signed)
 Labs received from:[facility]   Drawn on:12/30/2023  Reviewed by:Waddell Craze  Labs drawn:Iron TIBC, and Ferritin Panel, Testerone, Biovailable and total, Lipid Panel, Magnesium, CMP, CBC, Cortisol, Thyroid Peroxidase,T4, TSH, Vitamin B12/Folate, Vitamin D, Amylase and Lipase, Cardio IQ  Results:Testerone 107.6    Cholesterol 208    ESR 2    HS CRP 1.4    Glucose 123    Absolute Eosinophils 0    Lipase 72

## 2024-02-18 NOTE — Progress Notes (Signed)
 Office Visit Note  Patient: Jon Henderson             Date of Birth: 1971-05-14           MRN: [id number]             PCP: Default, Provider, MD Referring: No ref. provider found Visit Date: 03/03/2024 Occupation: @GUAROCC @  Subjective:  Lower back pain  History of Present Illness: Jon Henderson is a 53 y.o. male with ankylosing spondylitis.  He returns today after his last visit in March 2025.  He states he continues to have pain and stiffness in his hands and he had to resize his wedding band.  He also continues to have discomfort in the right SI joint region.  He states it happens about 2-3 times a week.  The pain sometimes radiates down into his leg.  He continues to have a stiffness in the cervical and thoracic spine especially on the right side.  The lower back pain persists.  He states that physical therapy was helpful.  He has been on Taltz  80 mg subcu injections on a monthly basis.    Activities of Daily Living:  Patient reports morning stiffness for all day.    Patient Denies nocturnal pain.  Difficulty dressing/grooming: Denies Difficulty climbing stairs: Reports Difficulty getting out of chair: Reports Difficulty using hands for taps, buttons, cutlery, and/or writing: Reports  Review of Systems  Constitutional:  Positive for fatigue.  HENT:  Negative for mouth sores and mouth dryness.   Eyes:  Negative for dryness.  Respiratory:  Positive for shortness of breath.   Cardiovascular:  Negative for chest pain and palpitations.  Gastrointestinal:  Negative for blood in stool, constipation and diarrhea.  Endocrine: Negative for increased urination.  Genitourinary:  Negative for involuntary urination.  Musculoskeletal:  Positive for joint pain, gait problem, joint pain, joint swelling, myalgias, muscle weakness, morning stiffness, muscle tenderness and myalgias.  Skin:  Negative for color change, rash and sensitivity to sunlight.  Allergic/Immunologic: Negative for  susceptible to infections.  Neurological:  Positive for dizziness and headaches.  Hematological:  Negative for swollen glands.  Psychiatric/Behavioral:  Positive for sleep disturbance. Negative for depressed mood. The patient is nervous/anxious.     PMFS History:  Patient Active Problem List   Diagnosis Date Noted   Ankylosing spondylitis (HCC) 06/12/2016   Sacroiliitis (HCC) 06/12/2016   High risk medication use 06/12/2016   Cardiac dysrhythmia 04/20/2008   DDD (degenerative disc disease), lumbar 04/20/2008   COUGH 04/20/2008    Past Medical History:  Diagnosis Date   Ankylosing spondylitis (HCC)    Disc disorder of lumbar region     Family History  Problem Relation Age of Onset   Ankylosing spondylitis Mother    COPD Father    Drug abuse Sister    Alcohol abuse Sister    Stroke Maternal Grandfather    Healthy Daughter    ADD / ADHD Daughter    Healthy Daughter    ADD / ADHD Daughter    Healthy Daughter    Past Surgical History:  Procedure Laterality Date   LUMBAR DISC SURGERY     Social History   Social History Narrative   Not on file   Immunization History  Administered Date(s) Administered   Influenza-Unspecified 03/23/2018   PFIZER(Purple Top)SARS-COV-2 Vaccination 08/29/2019, 09/19/2019     Objective: Vital Signs: BP 111/78 (BP Location: Left Arm, Patient Position: Sitting, Cuff Size: Normal)   Pulse 92   Resp  17   Ht 6' (1.829 m)   Wt 196 lb 3.2 oz (89 kg)   BMI 26.61 kg/m    Physical Exam Vitals and nursing note reviewed.  Constitutional:      Appearance: He is well-developed.  HENT:     Head: Normocephalic and atraumatic.  Eyes:     Conjunctiva/sclera: Conjunctivae normal.     Pupils: Pupils are equal, round, and reactive to light.  Cardiovascular:     Rate and Rhythm: Normal rate and regular rhythm.     Heart sounds: Normal heart sounds.  Pulmonary:     Effort: Pulmonary effort is normal.     Breath sounds: Normal breath sounds.   Abdominal:     General: Bowel sounds are normal.     Palpations: Abdomen is soft.  Musculoskeletal:     Cervical back: Normal range of motion and neck supple.  Skin:    General: Skin is warm and dry.     Capillary Refill: Capillary refill takes less than 2 seconds.  Neurological:     Mental Status: He is alert and oriented to person, place, and time.  Psychiatric:        Behavior: Behavior normal.      Musculoskeletal Exam: He has some limitation with lateral rotation of the cervical spine.  He had no tenderness over thoracic or lumbar spine.  He had mild tenderness over the right SI joint.  Shoulders, elbows, wrist joints were in good range of motion.  He had bilateral PIP and DIP thickening with no synovitis.  Hip joints and knee joints in good range of motion without any warmth swelling or effusion.  There was no tenderness over ankles or MTPs.  There was no plantar fasciitis or Achilles tendinitis.  CDAI Exam: CDAI Score: -- Patient Global: --; Provider Global: -- Swollen: --; Tender: -- Joint Exam 03/03/2024   No joint exam has been documented for this visit   There is currently no information documented on the homunculus. Go to the Rheumatology activity and complete the homunculus joint exam.  Investigation: No additional findings.  Imaging: No results found.  Recent Labs: Lab Results  Component Value Date   WBC 6.2 01/01/2024   HGB 14.2 01/01/2024   PLT 228 01/01/2024   NA 140 01/01/2024   K 3.9 01/01/2024   CL 102 01/01/2024   CO2 28 01/01/2024   GLUCOSE 80 01/01/2024   BUN 21 01/01/2024   CREATININE 1.04 01/01/2024   BILITOT 0.7 01/01/2024   ALKPHOS 64 07/16/2018   AST 28 01/01/2024   ALT 21 01/01/2024   PROT 6.8 01/01/2024   ALBUMIN 4.3 07/16/2018   CALCIUM 9.1 01/01/2024   GFRAA 94 11/19/2020   QFTBGOLDPLUS NEGATIVE 08/26/2023    Speciality Comments: No specialty comments available.  Procedures:  No procedures performed Allergies: Patient has  no known allergies.   Assessment / Plan:     Visit Diagnoses: Ankylosing spondylitis of multiple sites in spine (HCC)-patient reports intermittent discomfort in the SI joint especially the right SI joint.  He states currently the SI joint is not flaring.  He states that physical therapy was helpful.  He has been doing stretching exercises.  He remains on Taltz  80 mg subcu monthly.  High risk medication use - Taltz  80 mg sq injections every 28 days.  January 01, 2024 CBC and CMP were normal.  TB Gold was negative on August 26, 2023.  He was advised to get repeat labs in September and every 3 months.  Information regarding physician was placed in the AVS.  He was advised to hold Taltz  if he develops an infection and resume after the infection resolves.  Sacroiliitis (HCC) -he gets prednisone  taper as needed.  He had cortisone injection in the right SI joint in March 2025 which was helpful.  He reports intermittent discomfort in the right SI joint.  DDD (degenerative disc disease), cervical-it continues to have some stiffness and limited range of motion of the cervical spine.  DDD (degenerative disc disease), thoracic-he reports chronic discomfort off and on.  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  He had good response to the injection.  He also noted benefits from physical therapy and has been doing exercises at home.  Degeneration of intervertebral disc of lumbar region with discogenic back pain -he was referred to PT.  He noted improvement in his lower back pain.   History of gastroesophageal reflux (GERD)  Other fatigue  Neuropathy  Orders: No orders of the defined types were placed in this encounter.  No orders of the defined types were placed in this encounter.    Follow-Up Instructions: Return in about 5 months (around 08/03/2024) for Ankylosing spondylitis.   Maya Nash, MD  Note - This record has been created using Animal nutritionist.  Chart creation errors  have been sought, but may not always  have been located. Such creation errors do not reflect on  the standard of medical care.

## 2024-03-02 ENCOUNTER — Other Ambulatory Visit: Payer: Self-pay | Admitting: Rheumatology

## 2024-03-02 DIAGNOSIS — M458 Ankylosing spondylitis sacral and sacrococcygeal region: Secondary | ICD-10-CM

## 2024-03-02 NOTE — Telephone Encounter (Signed)
 Last Fill: 12/23/2023  Labs: 01/01/2024 CBC and CMP are normal.   TB Gold: 08/26/2023 TB Gold Negative    Next Visit: 03/03/2024  Last Visit: 10/06/2023  DX: Ankylosing spondylitis of multiple sites in spine   Current Dose per office note 10/06/2023: Taltz  80 mg sq injections every 28 days   Okay to refill Taltz ?

## 2024-03-03 ENCOUNTER — Encounter: Payer: Self-pay | Admitting: Rheumatology

## 2024-03-03 ENCOUNTER — Ambulatory Visit: Attending: Rheumatology | Admitting: Rheumatology

## 2024-03-03 VITALS — BP 111/78 | HR 92 | Resp 17 | Ht 72.0 in | Wt 196.2 lb

## 2024-03-03 DIAGNOSIS — M5136 Other intervertebral disc degeneration, lumbar region with discogenic back pain only: Secondary | ICD-10-CM

## 2024-03-03 DIAGNOSIS — R5383 Other fatigue: Secondary | ICD-10-CM

## 2024-03-03 DIAGNOSIS — M5030 Other cervical disc degeneration, unspecified cervical region: Secondary | ICD-10-CM | POA: Diagnosis not present

## 2024-03-03 DIAGNOSIS — Z79899 Other long term (current) drug therapy: Secondary | ICD-10-CM | POA: Diagnosis not present

## 2024-03-03 DIAGNOSIS — M533 Sacrococcygeal disorders, not elsewhere classified: Secondary | ICD-10-CM

## 2024-03-03 DIAGNOSIS — M461 Sacroiliitis, not elsewhere classified: Secondary | ICD-10-CM | POA: Diagnosis not present

## 2024-03-03 DIAGNOSIS — G8929 Other chronic pain: Secondary | ICD-10-CM

## 2024-03-03 DIAGNOSIS — M450 Ankylosing spondylitis of multiple sites in spine: Secondary | ICD-10-CM

## 2024-03-03 DIAGNOSIS — M5134 Other intervertebral disc degeneration, thoracic region: Secondary | ICD-10-CM

## 2024-03-03 DIAGNOSIS — Z8719 Personal history of other diseases of the digestive system: Secondary | ICD-10-CM

## 2024-03-03 DIAGNOSIS — G629 Polyneuropathy, unspecified: Secondary | ICD-10-CM

## 2024-03-03 NOTE — Patient Instructions (Signed)
 Standing Labs We placed an order today for your standing lab work.   Please have your standing labs drawn in September and every 3 months  Please have your labs drawn 2 weeks prior to your appointment so that the provider can discuss your lab results at your appointment, if possible.  Please note that you may see your imaging and lab results in MyChart before we have reviewed them. We will contact you once all results are reviewed. Please allow our office up to 72 hours to thoroughly review all of the results before contacting the office for clarification of your results.  WALK-IN LAB HOURS  Monday through Thursday from 8:00 am -12:30 pm and 1:00 pm-4:30 pm and Friday from 8:00 am-12:00 pm.  Patients with office visits requiring labs will be seen before walk-in labs.  You may encounter longer than normal wait times. Please allow additional time. Wait times may be shorter on  Monday and Thursday afternoons.  We do not book appointments for walk-in labs. We appreciate your patience and understanding with our staff.   Labs are drawn by [facility]. Please bring your co-pay at the time of your lab draw.  You may receive a bill from [facility] for your lab work.  Please note if you are on Hydroxychloroquine  and and an order has been placed for a Hydroxychloroquine  level,  you will need to have it drawn 4 hours or more after your last dose.  If you wish to have your labs drawn at another location, please call the office 24 hours in advance so we can fax the orders.  The office is located at [address], [place], [place] [address]   If you have any questions regarding directions or hours of operation,  please call [phone].   As a reminder, please drink plenty of water prior to coming for your lab work. Thanks!   Vaccines You are taking a medication(s) that can suppress your immune system.  The following immunizations are recommended: Flu annually Covid-19  Td/Tdap (tetanus,  diphtheria, pertussis) every 10 years Pneumonia (Prevnar 15 then Pneumovax 23 at least 1 year apart.  Alternatively, can take Prevnar 20 without needing additional dose) Shingrix: 2 doses from 4 weeks to 6 months apart  Please check with your PCP to make sure you are up to date.   If you have signs or symptoms of an infection or start antibiotics: First, call your PCP for workup of your infection. Hold your medication through the infection, until you complete your antibiotics, and until symptoms resolve if you take the following: Injectable medication (Actemra, Benlysta, Cimzia, Cosentyx, Enbrel, Humira , Kevzara, Orencia, Remicade, Simponi, Stelara, Taltz, Tremfya) Methotrexate  Leflunomide (Arava) Mycophenolate (Cellcept) Xeljanz, Olumiant, or Rinvoq

## 2024-06-01 ENCOUNTER — Other Ambulatory Visit: Payer: Self-pay | Admitting: *Deleted

## 2024-06-01 DIAGNOSIS — M458 Ankylosing spondylitis sacral and sacrococcygeal region: Secondary | ICD-10-CM

## 2024-06-01 MED ORDER — TALTZ 80 MG/ML ~~LOC~~ SOAJ: SUBCUTANEOUS | 0 refills | Status: DC

## 2024-06-01 NOTE — Telephone Encounter (Signed)
 Patient requesting sample.  Last Fill: 03/02/2024  Labs: 01/01/2024 CBC and CMP are normal. I called patient, labs are due.  TB Gold: 08/26/2023  TB Gold negative.   Next Visit: 08/03/2024  Last Visit: 03/03/2024  DX: Ankylosing spondylitis of multiple sites in spine   Current Dose per office note 03/03/2024: Taltz  80 mg sq injections every 28 days.   Okay to refill Taltz ?

## 2024-07-21 NOTE — Progress Notes (Deleted)
 "  Office Visit Note  Patient: Jon Henderson             Date of Birth: 25-Nov-1970           MRN: [id number]             PCP: Default, Provider, MD Referring: No ref. provider found Visit Date: 08/03/2024 Occupation: Data Unavailable  Subjective:    History of Present Illness: Jon Henderson is a 53 y.o. male with history of ankylosing spondylitis and DDD.  Patient remains on Taltz  80 mg sq injections every 28 days   CBC and CMP updated on 01/01/24. Orders for CBC and CMP released today.   TB gold negative on 08/26/23. Order for TB gold released today.  Discussed the importance of holding taltz  if he develops signs or symptoms of an infection and to resume once the infection has completely cleared.      Activities of Daily Living:  Patient reports morning stiffness for *** {minute/hour:19697}.   Patient {ACTIONS;DENIES/REPORTS:21021675::Denies} nocturnal pain.  Difficulty dressing/grooming: {ACTIONS;DENIES/REPORTS:21021675::Denies} Difficulty climbing stairs: {ACTIONS;DENIES/REPORTS:21021675::Denies} Difficulty getting out of chair: {ACTIONS;DENIES/REPORTS:21021675::Denies} Difficulty using hands for taps, buttons, cutlery, and/or writing: {ACTIONS;DENIES/REPORTS:21021675::Denies}  No Rheumatology ROS completed.   PMFS History:  Patient Active Problem List   Diagnosis Date Noted   Ankylosing spondylitis (HCC) 06/12/2016   Sacroiliitis 06/12/2016   High risk medication use 06/12/2016   Cardiac dysrhythmia 04/20/2008   DDD (degenerative disc disease), lumbar 04/20/2008   COUGH 04/20/2008    Past Medical History:  Diagnosis Date   Ankylosing spondylitis (HCC)    Disc disorder of lumbar region     Family History  Problem Relation Age of Onset   Ankylosing spondylitis Mother    COPD Father    Drug abuse Sister    Alcohol abuse Sister    Stroke Maternal Grandfather    Healthy Daughter    ADD / ADHD Daughter    Healthy Daughter    ADD / ADHD Daughter     Healthy Daughter    Past Surgical History:  Procedure Laterality Date   LUMBAR DISC SURGERY     Social History[1] Social History   Social History Narrative   Not on file     Immunization History  Administered Date(s) Administered   Influenza-Unspecified 03/23/2018   PFIZER(Purple Top)SARS-COV-2 Vaccination 08/29/2019, 09/19/2019     Objective: Vital Signs: There were no vitals taken for this visit.   Physical Exam Vitals and nursing note reviewed.  Constitutional:      Appearance: He is well-developed.  HENT:     Head: Normocephalic and atraumatic.  Eyes:     Conjunctiva/sclera: Conjunctivae normal.     Pupils: Pupils are equal, round, and reactive to light.  Cardiovascular:     Rate and Rhythm: Normal rate and regular rhythm.     Heart sounds: Normal heart sounds.  Pulmonary:     Effort: Pulmonary effort is normal.     Breath sounds: Normal breath sounds.  Abdominal:     General: Bowel sounds are normal.     Palpations: Abdomen is soft.  Musculoskeletal:     Cervical back: Normal range of motion and neck supple.  Skin:    General: Skin is warm and dry.     Capillary Refill: Capillary refill takes less than 2 seconds.  Neurological:     Mental Status: He is alert and oriented to person, place, and time.  Psychiatric:        Behavior: Behavior normal.  Musculoskeletal Exam: ***  CDAI Exam: CDAI Score: -- Patient Global: --; Provider Global: -- Swollen: --; Tender: -- Joint Exam 08/03/2024   No joint exam has been documented for this visit   There is currently no information documented on the homunculus. Go to the Rheumatology activity and complete the homunculus joint exam.  Investigation: No additional findings.  Imaging: No results found.  Recent Labs: Lab Results  Component Value Date   WBC 6.2 01/01/2024   HGB 14.2 01/01/2024   PLT 228 01/01/2024   NA 140 01/01/2024   K 3.9 01/01/2024   CL 102 01/01/2024   CO2 28 01/01/2024    GLUCOSE 80 01/01/2024   BUN 21 01/01/2024   CREATININE 1.04 01/01/2024   BILITOT 0.7 01/01/2024   ALKPHOS 64 07/16/2018   AST 28 01/01/2024   ALT 21 01/01/2024   PROT 6.8 01/01/2024   ALBUMIN 4.3 07/16/2018   CALCIUM 9.1 01/01/2024   GFRAA 94 11/19/2020   QFTBGOLDPLUS NEGATIVE 08/26/2023    Speciality Comments: No specialty comments available.  Procedures:  No procedures performed Allergies: Patient has no known allergies.   Assessment / Plan:     Visit Diagnoses: Ankylosing spondylitis of multiple sites in spine (HCC)  High risk medication use  Sacroiliitis  DDD (degenerative disc disease), cervical  DDD (degenerative disc disease), thoracic  Chronic right SI joint pain  Degeneration of intervertebral disc of lumbar region with discogenic back pain  History of gastroesophageal reflux (GERD)  Other fatigue  Neuropathy  Orders: No orders of the defined types were placed in this encounter.  No orders of the defined types were placed in this encounter.   Face-to-face time spent with patient was *** minutes. Greater than 50% of time was spent in counseling and coordination of care.  Follow-Up Instructions: No follow-ups on file.   Waddell CHRISTELLA Craze, PA-C  Note - This record has been created using Dragon software.  Chart creation errors have been sought, but may not always  have been located. Such creation errors do not reflect on  the standard of medical care.     [1]  Social History Tobacco Use   Smoking status: Former    Current packs/day: 0.00    Average packs/day: 1 pack/day for 10.0 years (10.0 ttl pk-yrs)    Types: Cigarettes    Start date: 06/25/1991    Quit date: 06/24/2001    Years since quitting: 23.0    Passive exposure: Never   Smokeless tobacco: Former    Types: Snuff  Vaping Use   Vaping status: Never Used  Substance Use Topics   Alcohol use: Not Currently   Drug use: No   "

## 2024-08-03 ENCOUNTER — Ambulatory Visit: Admitting: Physician Assistant

## 2024-08-03 DIAGNOSIS — R5383 Other fatigue: Secondary | ICD-10-CM

## 2024-08-03 DIAGNOSIS — M450 Ankylosing spondylitis of multiple sites in spine: Secondary | ICD-10-CM

## 2024-08-03 DIAGNOSIS — M461 Sacroiliitis, not elsewhere classified: Secondary | ICD-10-CM

## 2024-08-03 DIAGNOSIS — G8929 Other chronic pain: Secondary | ICD-10-CM

## 2024-08-03 DIAGNOSIS — M5136 Other intervertebral disc degeneration, lumbar region with discogenic back pain only: Secondary | ICD-10-CM

## 2024-08-03 DIAGNOSIS — G629 Polyneuropathy, unspecified: Secondary | ICD-10-CM

## 2024-08-03 DIAGNOSIS — Z79899 Other long term (current) drug therapy: Secondary | ICD-10-CM

## 2024-08-03 DIAGNOSIS — M5030 Other cervical disc degeneration, unspecified cervical region: Secondary | ICD-10-CM

## 2024-08-03 DIAGNOSIS — Z8719 Personal history of other diseases of the digestive system: Secondary | ICD-10-CM

## 2024-08-03 DIAGNOSIS — M5134 Other intervertebral disc degeneration, thoracic region: Secondary | ICD-10-CM

## 2024-08-09 ENCOUNTER — Other Ambulatory Visit: Payer: Self-pay | Admitting: *Deleted

## 2024-08-09 DIAGNOSIS — Z79899 Other long term (current) drug therapy: Secondary | ICD-10-CM

## 2024-08-09 DIAGNOSIS — Z9225 Personal history of immunosupression therapy: Secondary | ICD-10-CM

## 2024-08-09 DIAGNOSIS — Z111 Encounter for screening for respiratory tuberculosis: Secondary | ICD-10-CM

## 2024-08-10 ENCOUNTER — Ambulatory Visit: Payer: Self-pay | Admitting: Rheumatology

## 2024-08-11 LAB — COMPREHENSIVE METABOLIC PANEL WITH GFR
AG Ratio: 1.6 (calc) (ref 1.0–2.5)
ALT: 17 U/L (ref 9–46)
AST: 19 U/L (ref 10–35)
Albumin: 4.2 g/dL (ref 3.6–5.1)
Alkaline phosphatase (APISO): 68 U/L (ref 35–144)
BUN: 16 mg/dL (ref 7–25)
CO2: 25 mmol/L (ref 20–32)
Calcium: 9 mg/dL (ref 8.6–10.3)
Chloride: 101 mmol/L (ref 98–110)
Creat: 0.81 mg/dL (ref 0.70–1.30)
Globulin: 2.7 g/dL (ref 1.9–3.7)
Glucose, Bld: 110 mg/dL — ABNORMAL HIGH (ref 65–99)
Potassium: 3.9 mmol/L (ref 3.5–5.3)
Sodium: 136 mmol/L (ref 135–146)
Total Bilirubin: 0.6 mg/dL (ref 0.2–1.2)
Total Protein: 6.9 g/dL (ref 6.1–8.1)
eGFR: 105 mL/min/1.73m2

## 2024-08-11 LAB — CBC WITH DIFFERENTIAL/PLATELET
Absolute Lymphocytes: 1482 {cells}/uL (ref 850–3900)
Absolute Monocytes: 865 {cells}/uL (ref 200–950)
Basophils Absolute: 38 {cells}/uL (ref 0–200)
Basophils Relative: 0.4 %
Eosinophils Absolute: 95 {cells}/uL (ref 15–500)
Eosinophils Relative: 1 %
HCT: 46 % (ref 39.4–51.1)
Hemoglobin: 15.6 g/dL (ref 13.2–17.1)
MCH: 32 pg (ref 27.0–33.0)
MCHC: 33.9 g/dL (ref 31.6–35.4)
MCV: 94.5 fL (ref 81.4–101.7)
MPV: 9.6 fL (ref 7.5–12.5)
Monocytes Relative: 9.1 %
Neutro Abs: 7021 {cells}/uL (ref 1500–7800)
Neutrophils Relative %: 73.9 %
Platelets: 269 Thousand/uL (ref 140–400)
RBC: 4.87 Million/uL (ref 4.20–5.80)
RDW: 12.8 % (ref 11.0–15.0)
Total Lymphocyte: 15.6 %
WBC: 9.5 Thousand/uL (ref 3.8–10.8)

## 2024-08-11 LAB — QUANTIFERON-TB GOLD PLUS
Mitogen-NIL: >10 [IU]/mL
NIL: 0.02 [IU]/mL
QuantiFERON-TB Gold Plus: NEGATIVE
TB1-NIL: 0 [IU]/mL
TB2-NIL: 0 [IU]/mL

## 2024-08-12 NOTE — Progress Notes (Signed)
 TB Gold is negative.

## 2024-08-30 ENCOUNTER — Other Ambulatory Visit: Payer: Self-pay | Admitting: Rheumatology

## 2024-08-30 DIAGNOSIS — M458 Ankylosing spondylitis sacral and sacrococcygeal region: Secondary | ICD-10-CM

## 2024-08-31 NOTE — Telephone Encounter (Signed)
 Last Fill: 06/01/2024  Labs: 08/09/2024  CBC and CMP are normal. Glucose is mildly elevated probably not a fasting sample.   TB Gold: 08/09/2024 negative    Next Visit: 10/04/2024  Last Visit: 03/03/2024  DX: Ankylosing spondylitis of multiple sites in spine   Current Dose per office note on 03/03/2024: Taltz  80 mg sq injections every 28 days.   Okay to refill Taltz ?

## 2024-09-02 ENCOUNTER — Other Ambulatory Visit: Payer: Self-pay

## 2024-09-02 ENCOUNTER — Emergency Department (HOSPITAL_BASED_OUTPATIENT_CLINIC_OR_DEPARTMENT_OTHER)
Admission: EM | Admit: 2024-09-02 | Discharge: 2024-09-02 | Discharge status: Against Medical Advice | Source: Home / Self Care

## 2024-09-02 ENCOUNTER — Encounter (HOSPITAL_BASED_OUTPATIENT_CLINIC_OR_DEPARTMENT_OTHER): Payer: Self-pay

## 2024-09-02 LAB — BASIC METABOLIC PANEL WITH GFR
Anion gap: 16 — ABNORMAL HIGH (ref 5–15)
BUN: 12 mg/dL (ref 6–20)
CO2: 26 mmol/L (ref 22–32)
Calcium: 9.4 mg/dL (ref 8.9–10.3)
Chloride: 99 mmol/L (ref 98–111)
Creatinine, Ser: 0.8 mg/dL (ref 0.61–1.24)
GFR, Estimated: >60 mL/min
Glucose, Bld: 126 mg/dL — ABNORMAL HIGH (ref 70–99)
Potassium: 3.6 mmol/L (ref 3.5–5.1)
Sodium: 141 mmol/L (ref 135–145)

## 2024-09-02 LAB — CBC WITH DIFFERENTIAL/PLATELET
Abs Immature Granulocytes: 0.01 10*3/uL (ref 0.00–0.07)
Basophils Absolute: 0 10*3/uL (ref 0.0–0.1)
Basophils Relative: 1 %
Eosinophils Absolute: 0 10*3/uL (ref 0.0–0.5)
Eosinophils Relative: 0 %
HCT: 45.5 % (ref 39.0–52.0)
Hemoglobin: 15.8 g/dL (ref 13.0–17.0)
Immature Granulocytes: 0 %
Lymphocytes Relative: 28 %
Lymphs Abs: 1.6 10*3/uL (ref 0.7–4.0)
MCH: 32.7 pg (ref 26.0–34.0)
MCHC: 34.7 g/dL (ref 30.0–36.0)
MCV: 94.2 fL (ref 80.0–100.0)
Monocytes Absolute: 1.1 10*3/uL — ABNORMAL HIGH (ref 0.1–1.0)
Monocytes Relative: 20 %
Neutro Abs: 2.9 10*3/uL (ref 1.7–7.7)
Neutrophils Relative %: 51 %
Platelets: 235 10*3/uL (ref 150–400)
RBC: 4.83 MIL/uL (ref 4.22–5.81)
RDW: 13.8 % (ref 11.5–15.5)
WBC: 5.7 10*3/uL (ref 4.0–10.5)
nRBC: 0 % (ref 0.0–0.2)

## 2024-09-02 NOTE — ED Triage Notes (Signed)
 Pt reports wanting to detox from alcohol. Pt reports last drink x3 hours ago. Pt reports drinking 1/2 fifth a day. Pt reports anxiety, HA, nausea.

## 2024-10-04 ENCOUNTER — Ambulatory Visit: Admitting: Physician Assistant
